# Patient Record
Sex: Female | Born: 1995
Health system: Southern US, Community
[De-identification: ages and names within clinical notes are randomized; demographics above are authoritative.]

## PROBLEM LIST (undated history)

## (undated) DIAGNOSIS — E119 Type 2 diabetes mellitus without complications: Secondary | ICD-10-CM

---
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
## 2014-01-01 ENCOUNTER — Encounter (HOSPITAL_COMMUNITY): Payer: Self-pay | Admitting: Otolaryngology

## 2014-01-01 DIAGNOSIS — Z930 Tracheostomy status: Secondary | ICD-10-CM

## 2014-01-01 DIAGNOSIS — J384 Edema of larynx: Secondary | ICD-10-CM

## 2014-01-01 DIAGNOSIS — J390 Retropharyngeal and parapharyngeal abscess: Principal | ICD-10-CM

## 2014-01-01 DIAGNOSIS — E119 Type 2 diabetes mellitus without complications: Secondary | ICD-10-CM

## 2014-01-01 DIAGNOSIS — Z430 Encounter for attention to tracheostomy: Secondary | ICD-10-CM

## 2014-01-01 LAB — CBC WITH DIFFERENTIAL/PLATELET
BASOS ABS: 0 10*3/uL (ref 0.0–0.1)
Basophils Relative: 0 % (ref 0–1)
Eosinophils Absolute: 0 10*3/uL (ref 0.0–0.7)
Eosinophils Relative: 0 % (ref 0–5)
HCT: 30.7 % — ABNORMAL LOW (ref 36.0–46.0)
Hemoglobin: 10.1 g/dL — ABNORMAL LOW (ref 12.0–15.0)
Lymphocytes Relative: 12 % (ref 12–46)
Lymphs Abs: 1.1 10*3/uL (ref 0.7–4.0)
MCH: 27.2 pg (ref 26.0–34.0)
MCHC: 32.9 g/dL (ref 30.0–36.0)
MCV: 82.7 fL (ref 78.0–100.0)
Monocytes Absolute: 0.5 10*3/uL (ref 0.1–1.0)
Monocytes Relative: 6 % (ref 3–12)
NEUTROS ABS: 7.7 10*3/uL (ref 1.7–7.7)
Neutrophils Relative %: 83 % — ABNORMAL HIGH (ref 43–77)
PLATELETS: 279 10*3/uL (ref 150–400)
RBC: 3.71 MIL/uL — ABNORMAL LOW (ref 3.87–5.11)
RDW: 13.3 % (ref 11.5–15.5)
WBC: 9.3 10*3/uL (ref 4.0–10.5)

## 2014-01-01 LAB — GLUCOSE, CAPILLARY
GLUCOSE-CAPILLARY: 129 mg/dL — AB (ref 70–99)
GLUCOSE-CAPILLARY: 129 mg/dL — AB (ref 70–99)
GLUCOSE-CAPILLARY: 221 mg/dL — AB (ref 70–99)
Glucose-Capillary: 165 mg/dL — ABNORMAL HIGH (ref 70–99)
Glucose-Capillary: 185 mg/dL — ABNORMAL HIGH (ref 70–99)
Glucose-Capillary: 135 mg/dL — ABNORMAL HIGH (ref 70–99)

## 2014-01-01 LAB — FUNGUS CULTURE W SMEAR: Fungal Smear: NONE SEEN

## 2014-01-01 LAB — BASIC METABOLIC PANEL
BUN: 7 mg/dL (ref 6–23)
CO2: 24 meq/L (ref 19–32)
CREATININE: 0.32 mg/dL — AB (ref 0.50–1.10)
Calcium: 9.2 mg/dL (ref 8.4–10.5)
Chloride: 99 mEq/L (ref 96–112)
GFR calc Af Amer: >90 mL/min (ref >90)
GFR calc non Af Amer: >90 mL/min (ref >90)
GLUCOSE: 208 mg/dL — AB (ref 70–99)
Potassium: 4.1 mEq/L (ref 3.7–5.3)
Sodium: 139 mEq/L (ref 137–147)

## 2014-01-01 MED ORDER — DIPHENHYDRAMINE HCL 50 MG/ML IJ SOLN: 25.0000 mg | Freq: Four times a day (QID) | INTRAMUSCULAR | Status: DC | PRN

## 2014-01-01 MED ORDER — DIPHENHYDRAMINE HCL 50 MG/ML IJ SOLN
25.0000 mg | Freq: Four times a day (QID) | INTRAMUSCULAR | Status: DC | PRN
  Administered 2014-01-02 (×2): 25 mg via INTRAVENOUS
  Filled 2014-01-01 (×2): qty 1

## 2014-01-01 MED ORDER — CEFTRIAXONE SODIUM 2 G IJ SOLR
2.0000 g | INTRAMUSCULAR | Status: DC
  Administered 2014-01-01 – 2014-01-04 (×4): 2 g via INTRAVENOUS
  Filled 2014-01-01 (×5): qty 2

## 2014-01-01 MED ORDER — GLUCERNA 1.2 CAL PO LIQD
1000.0000 mL | ORAL | Status: DC
  Administered 2014-01-01 – 2014-01-02 (×2): 1000 mL
  Filled 2014-01-01 (×5): qty 1000

## 2014-01-01 MED ORDER — INSULIN ASPART PROT & ASPART (70-30 MIX) 100 UNIT/ML ~~LOC~~ SUSP
16.0000 [IU] | Freq: Two times a day (BID) | SUBCUTANEOUS | Status: DC
  Administered 2014-01-01 – 2014-01-05 (×8): 16 [IU] via SUBCUTANEOUS
  Filled 2014-01-01: qty 10

## 2014-01-01 NOTE — Progress Notes (Signed)
Subjective: Patient seen at bedside this AM. No obvious complaints, in no acute distress. Pain adequately controlled, no respiratory distress, on trach collar. No fever, chills, nausea or vomiting.   S/p tracheostomy d/t laryngeal edema. NGT (panda) in place.  Objective: Vital signs in last 24 hours: Filed Vitals:   01/01/14 0001 01/01/14 0403 01/01/14 0435 01/01/14 0717  BP: 130/78  102/66 111/59  Pulse: 74 68 57 64  Temp:   98.2 F (36.8 C) 98.8 F (37.1 C)  TempSrc:   Oral Oral  Resp: 12 15 13 13   Height:      Weight:      SpO2: 100% 100% 100% 100%   Weight change:   Intake/Output Summary (Last 24 hours) at 01/01/14 0743 Last data filed at 01/01/14 1610  Gross per 24 hour  Intake 3328.33 ml  Output   2105 ml  Net 1223.33 ml   Physical Exam: General: Alert, cooperative, NAD. On trach collar.  HEENT: PERRL, EOMI.  Lungs: Normal work of respiration. No wheezes, rales, or rhonchi. Heart: RRR. No murmurs, gallops, or rubs. Abdomen: Soft, non-tender, non-distended. BS + Extremities: No cyanosis, clubbing, or edema Neurologic: Alert & oriented X3, no focal sensory or motor abnormalities  Lab Results: Basic Metabolic Panel:  Recent Labs Lab 12/29/13 0256 12/30/13 0450  NA 142 135*  K 4.0 3.7  CL 102 96  CO2 27 25  GLUCOSE 200* 247*  BUN 6 4*  CREATININE 0.42* 0.34*  CALCIUM 8.7 8.7   CBC:  Recent Labs Lab 12/31/13 1035 01/01/14 0300  WBC 8.4 9.3  NEUTROABS 6.1 7.7  HGB 10.1* 10.1*  HCT 31.3* 30.7*  MCV 83.9 82.7  PLT 254 279   CBG:  Recent Labs Lab 12/31/13 1152 12/31/13 1759 12/31/13 2001 12/31/13 2342 01/01/14 0434 01/01/14 0708  GLUCAP 126* 171* 239* 221* 165* 185*   Micro Results: Recent Results (from the past 240 hour(s))  SURGICAL PCR SCREEN     Status: None   Collection Time    12/29/13  7:36 AM      Result Value Ref Range Status   MRSA, PCR NEGATIVE  NEGATIVE Final   Staphylococcus aureus NEGATIVE  NEGATIVE Final   Comment:             The Xpert SA Assay (FDA     approved for NASAL specimens     in patients over 68 years of age),     is one component of     a comprehensive surveillance     program.  Test performance has     been validated by [employer] for patients greater     than or equal to 66 year old.     It is not intended     to diagnose infection nor to     guide or monitor treatment.  ANAEROBIC CULTURE     Status: None   Collection Time    12/29/13  9:37 AM      Result Value Ref Range Status   Specimen Description ABSCESS NECK RIGHT   Final   Special Requests PT ON UNISYN   Final   Gram Stain     Final   Value: NO WBC SEEN     NO SQUAMOUS EPITHELIAL CELLS SEEN     NO ORGANISMS SEEN     Performed at [facility]   Culture     Final   Value: NO ANAEROBES ISOLATED; CULTURE IN PROGRESS FOR 5  DAYS     Performed at [facility]   Report Status PENDING   Incomplete  FUNGUS CULTURE W SMEAR     Status: None   Collection Time    12/29/13  9:37 AM      Result Value Ref Range Status   Specimen Description ABSCESS NECK RIGHT   Final   Special Requests PT ON UNISYN   Final   Fungal Smear     Final   Value: NO YEAST OR FUNGAL ELEMENTS SEEN     Performed at [facility]   Culture     Final   Value: CULTURE IN PROGRESS FOR FOUR WEEKS     Performed at [facility]   Report Status PENDING   Incomplete  AFB CULTURE WITH SMEAR     Status: None   Collection Time    12/29/13  9:37 AM      Result Value Ref Range Status   Specimen Description ABSCESS NECK RIGHT   Final   Special Requests PT ON UNISYN   Final   ACID FAST SMEAR     Final   Value: NO ACID FAST BACILLI SEEN     Performed at [facility]   Culture     Final   Value: CULTURE WILL BE EXAMINED FOR 6 WEEKS BEFORE ISSUING A FINAL REPORT     Performed at [facility]   Report Status PENDING   Incomplete  CULTURE, ROUTINE-ABSCESS     Status: None   Collection Time    12/29/13  9:37 AM       Result Value Ref Range Status   Specimen Description ABSCESS NECK RIGHT   Final   Special Requests PT ON UNISYN   Final   Gram Stain     Final   Value: ABUNDANT WBC PRESENT,BOTH PMN AND MONONUCLEAR     NO SQUAMOUS EPITHELIAL CELLS SEEN     ABUNDANT GRAM NEGATIVE RODS     Performed at [facility]     Performed at [facility]   Culture     Final   Value: MODERATE KLEBSIELLA PNEUMONIAE     Performed at [facility]   Report Status 12/31/2013 FINAL   Final   Organism ID, Bacteria KLEBSIELLA PNEUMONIAE   Final  GRAM STAIN     Status: None   Collection Time    12/29/13  9:37 AM      Result Value Ref Range Status   Specimen Description ABSCESS NECK RIGHT   Final   Special Requests PT ON UNISYN   Final   Gram Stain     Final   Value: ABUNDANT WBC PRESENT,BOTH PMN AND MONONUCLEAR     ABUNDANT GRAM NEGATIVE RODS   Report Status 12/29/2013 FINAL   Final   Medications: I have reviewed the patient's current medications. Scheduled Meds: . ciprofloxacin  400 mg Intravenous Q12H  . heparin subcutaneous  5,000 Units Subcutaneous 3 times per day  . insulin aspart  0-15 Units Subcutaneous Q4H  . insulin aspart protamine- aspart  13 Units Subcutaneous BID WC  . oxymetazoline  1 spray Each Nare BID   Continuous Infusions: . sodium chloride 1,000 mL (12/31/13 2222)   PRN Meds:.diphenhydrAMINE, HYDROcodone-acetaminophen, lidocaine, morphine injection, promethazine, promethazine  Assessment/Plan: [name] White is a 18 y.o. female w/ PMHx of uncontrolled DM type I (A1c 14%) and recent history of peritonsillar abscess s/p tonsillectomy and I&D, admitted for new right-sided lymph node abscess as  seen by neck CT.  Abscess of lymph node of neck: Patient s/p I&D of right lymph node abscess (12/29/13), culture positive for Klebsiella. Yesterday, patient w/ increased difficulty swallowing and swelling. Worsened laryngeal edema seen on laryngoscopy. Given dose of Decadron  yesterday (12/31/13). ENT performed tracheostomy, now on trach collar, in no acute distress. Unasyn changed to Cipro. Patient continues to have no leukocytosis. -ENT following, appreciate recommendations. Seen by Dr. Lazarus SalinesWolicki today, consulted ID for ABx recommendations given worsened clinical picture on Unasyn. Changed to Cipro yesterday, switched to Rocephin IV for now.  -For repeat laryngoscopy on Wednesday.  -Start tube feeds as per dietitian consult. -Hycet 7.5-325 (10-20 ml) q4h prn + Morphine prn for breakthrough -Phenergan prn for nausea  DM Type I: HbA1c as of 12/21/13, 14%. CBG's in the 160-220 range. On 70/30 13 units bid. Panda in place. To start tube feeds today. -Tube feeds per dietitian consult -Increase 70/30 to 16 units bid. If not adequately controlled, will switch to q8h 70/30. -SSI moderate -Repeat BMP in AM  DV PPx: Heparin Dalton  Dispo: Disposition is deferred at this time, awaiting improvement of current medical problems.   The patient does have a current PCP (Jeanann Lewandowskylugbemiga Jegede, MD) and [facility] after discharge.  The patient does not have transportation limitations that hinder transportation to clinic appointments.  .Services Needed at time of discharge: Y = Yes, Blank = No PT:   OT:   RN:   Equipment:   Other:     LOS: 4 days   Courtney ParisEden W Rumor Sun, MD 01/01/2014, 7:43 AM Pager: [phone]

## 2014-01-01 NOTE — Progress Notes (Signed)
Internal Medicine Attending  Date: 01/01/2014  Patient name: [name] White Medical record number: [id number] Date of birth: [birth year] Age: 18 y.o. Gender: female  I saw and evaluated the patient. I reviewed the resident's note by Dr. Yetta BarreJones and I agree with the resident's findings and plans as documented in his progress note.  S/p trach this weekend for worsening laryngeal edema.  Feels well this AM when seen on rounds although not very communicative.  Appreciate ID and ENT input and we will follow their advice.  Narrow to IV ceftriaxone and continue current supportive care.

## 2014-01-01 NOTE — Clinical Documentation Improvement (Signed)
THIS DOCUMENT IS NOT A PERMANENT PART OF THE MEDICAL RECORD  Please update your documentation with the medical record to reflect your response to this query. If you need help knowing how to do this please call [phone].  01/01/14   Dear Dr. Maximino SarinKlima/Associates,  In a better effort to capture your patient's severity of illness, reflect appropriate length of stay and utilization of resources, a review of the patient medical record has revealed the following indicators.    Based on your clinical judgment, please clarify and document in a progress note and/or discharge summary the clinical condition associated with the following supporting information:  In responding to this query please exercise your independent judgment.  The fact that a query is asked, does not imply that any particular answer is desired or expected.  Please clarify and specify Diabetes control, manifestations, and associated conditions.   Possible Clinical Conditions?   _______Diabetes: _______Controlled or Uncontrolled  Manifestations:  _______DM retinopathy  _______DM PVD _______DM neuropathy   _______DM nephropathy  Associated conditions: _______DM cellulitis _______DM gangrene _______DM gastroparesis _______DM hyperosmolarity state _______DM ketoacidosis with or without coma _______DM osteomyelitis _______DM skin ulcer  _______Other Condition _______Cannot Clinically determine     Risk Factors: Patient with a history of diabetes mellitus type 1, hgb A1c 14 on 12/21/13, noted per 02/19 progress notes.   You may use possible, probable, or suspect with inpatient documentation. possible, probable, suspected diagnoses MUST be documented at the time of discharge  Reviewed:  Additional documentation provided per 01/02/14 progress notes.                   Thank You,  Marciano SequinWanda Mathews-Bethea,RN,BSN, Clinical Documentation Specialist: [phone] Health Information Management [place]

## 2014-01-01 NOTE — Progress Notes (Signed)
INITIAL NUTRITION ASSESSMENT  DOCUMENTATION CODES Per approved criteria  -Not Applicable   INTERVENTION: Initiate Glucerna 1.2 @ 20 ml/hr via NGT and increase by 10 ml every 4 hours to goal rate of 60 ml/hr. At goal rate, tube feeding regimen will provide 1728 kcal, 86 grams of protein, and 1159 ml of H2O.  RD to continue to follow nutrition care plan.  NUTRITION DIAGNOSIS: Inadequate oral intake related to inability to eat as evidenced by NPO status.   Goal: Intake to meet >90% of estimated nutrition needs.  Monitor:  weight trends, lab trends, I/O's, initiation/tolerance of EN, initiation of PO's  Reason for Assessment: MD Consult for Initiation of Nutrition Support  18 y.o. female  Admitting Dx: Abscess of lymph node of neck  ASSESSMENT: PMHx significant for DM, L parapharyngeal abscess I&D and s/p L tonsillectomy 2/1. Admitted with rapid onset of R neck swelling and pain. Work-up reveals 2 R abscessed lymphnodes.  Underwent I&D of R-sided lymph node abscess 2/20. Pt with increased swallowing, throat pain and swelling on 2/22. Marked edema found to be causing difficulty swallowing, underwent trach to stabilize airway with placement of feeding tube on 2/22. Pt with small-bore feeding tube in place at this time, per radiology report, "small bore feeding tube with tip overlying the mid stomach."  Ordered for Carbohydrate Modified Medium diet on 2/20 - 2/22, was eating 75% of meals. Brief physical exam reveals pt is without significant fat or muscle mass loss.  Pt's DM is "out of control" per chart. Pt with HgbA1c of 14%. CBG's: 221, 165, 185.  No BM yet this admission.  Currently receiving IVF of NS at 100 ml/hr.  Height: Ht Readings from Last 1 Encounters:  12/28/13 5\' 5"  (1.651 m) (62%*, Z = 0.29)   * Growth percentiles are based on CDC 2-20 Years data.    Weight: Wt Readings from Last 1 Encounters:  12/28/13 134 lb 0.6 oz (60.8 kg) (65%*, Z = 0.40)   * Growth  percentiles are based on CDC 2-20 Years data.    Ideal Body Weight: 125 lb  % Ideal Body Weight: 107%  Wt Readings from Last 10 Encounters:  12/28/13 134 lb 0.6 oz (60.8 kg) (65%*, Z = 0.40)  12/28/13 134 lb 0.6 oz (60.8 kg) (65%*, Z = 0.40)  12/28/13 134 lb 0.6 oz (60.8 kg) (65%*, Z = 0.40)  12/26/13 138 lb (62.596 kg) (71%*, Z = 0.55)  12/21/13 134 lb (60.782 kg) (65%*, Z = 0.40)  12/07/13 135 lb (61.236 kg) (67%*, Z = 0.44)  12/07/13 135 lb (61.236 kg) (67%*, Z = 0.44)   * Growth percentiles are based on CDC 2-20 Years data.    Usual Body Weight: 135 lb  % Usual Body Weight: 100%  BMI:  Body mass index is 22.31 kg/(m^2). Normal weight  Estimated Nutritional Needs: Kcal: 1700 - 1900 Protein: 75 - 85 g Fluid: 1.7 - 1.9 liters daily  Skin:  R neck incision Bilateral neck incision  Diet Order: NPO  EDUCATION NEEDS: -No education needs identified at this time   Intake/Output Summary (Last 24 hours) at 01/01/14 1001 Last data filed at 01/01/14 0717  Gross per 24 hour  Intake 3328.33 ml  Output   2105 ml  Net 1223.33 ml    Last BM: PTA  Labs:   Recent Labs Lab 12/29/13 0256 12/30/13 0450 01/01/14 0758  NA 142 135* 139  K 4.0 3.7 4.1  CL 102 96 99  CO2 27 25 24  BUN 6 4* 7  CREATININE 0.42* 0.34* 0.32*  CALCIUM 8.7 8.7 9.2  GLUCOSE 200* 247* 208*    CBG (last 3)   Recent Labs  12/31/13 2342 01/01/14 0434 01/01/14 0708  GLUCAP 221* 165* 185*    Scheduled Meds: . ciprofloxacin  400 mg Intravenous Q12H  . heparin subcutaneous  5,000 Units Subcutaneous 3 times per day  . insulin aspart  0-15 Units Subcutaneous Q4H  . insulin aspart protamine- aspart  16 Units Subcutaneous BID WC  . oxymetazoline  1 spray Each Nare BID    Continuous Infusions: . sodium chloride 1,000 mL (12/31/13 2222)    Past Medical History  Diagnosis Date  . Diabetes mellitus without complication     had since she was 16    Past Surgical History  Procedure  Laterality Date  . Incision and drainage of peritonsillar abcess N/A 12/10/2013    Procedure: INCISION AND DRAINAGE OF PERITONSILLAR ABCESS;  Surgeon: Flo ShanksKarol Wolicki, MD;  [facility];  Service: ENT;  Laterality: N/A;  . Tonsillectomy Left 12/10/2013    Procedure: TONSILLECTOMY;  Surgeon: Flo ShanksKarol Wolicki, MD;  [facility];  Service: ENT;  Laterality: Left;  . Tonsillectomy  12/10/2013    Jarold MottoSamantha Shavawn Stobaugh MS, RD, LDN Inpatient Registered Dietitian Pager: [phone] After-hours pager: [phone]

## 2014-01-01 NOTE — Progress Notes (Signed)
Inpatient Diabetes Program Recommendations  AACE/ADA: New Consensus Statement on Inpatient Glycemic Control (2013)  Target Ranges:  Prepandial:   less than 140 mg/dL      Peak postprandial:   less than 180 mg/dL (1-2 hours)      Critically ill patients:  140 - 180 mg/dL   Reason for Visit: Type 1 diabetes.  Note patient is on continuous tube feeds due to trach/infection.  Consider changing frequency on Novolog 70/30 to q 8 hours due to continuous feeds.  May consider Novolog 70/30-10 units q 8 hours while on tube feeds.  Once patient is able to eat, then may change back to bid dosing.  Thanks, Beryl MeagerJenny Emmerie Battaglia, RN, BC-ADM Inpatient Diabetes Coordinator Pager [phone]

## 2014-01-01 NOTE — Progress Notes (Signed)
Patient ID: [name] SicksJuliana White, female   DOB: [birth year], 18 y.o.   MRN: [id number]         [facility]    Date of Admission:  12/28/2013   Prior antibiotic therapy from 1/29-2/10/2014        Day 6 current antibiotic therapy        Day 2 ciprofloxacin         Principal Problem:   Abscess of lymph node of neck Active Problems:   Parapharyngeal abscess   Diabetes mellitus type 1   Medically noncompliant   Dysphagia, oropharyngeal   Tracheostomy, acute management   . ciprofloxacin  400 mg Intravenous Q12H  . heparin subcutaneous  5,000 Units Subcutaneous 3 times per day  . insulin aspart  0-15 Units Subcutaneous Q4H  . insulin aspart protamine- aspart  16 Units Subcutaneous BID WC  . oxymetazoline  1 spray Each Nare BID   Objective: Temp:  [98.1 F (36.7 C)-99.2 F (37.3 C)] 98.7 F (37.1 C) (02/23 1301) Pulse Rate:  [57-105] 63 (02/23 1301) Resp:  [11-23] 13 (02/23 1301) BP: (92-131)/(44-86) 92/44 mmHg (02/23 1158) SpO2:  [98 %-100 %] 100 % (02/23 1301) FiO2 (%):  [28 %-40 %] 28 % (02/23 0717) Weight:  [63.4 kg (139 lb 12.4 oz)] 63.4 kg (139 lb 12.4 oz) (02/23 1108)  General: Alert with new tracheostomy Lungs: Clear Cor: Regular S1 and S2 with no murmurs  Lab Results Lab Results  Component Value Date   WBC 9.3 01/01/2014   HGB 10.1* 01/01/2014   HCT 30.7* 01/01/2014   MCV 82.7 01/01/2014   PLT 279 01/01/2014    Lab Results  Component Value Date   CREATININE 0.32* 01/01/2014   BUN 7 01/01/2014   NA 139 01/01/2014   K 4.1 01/01/2014   CL 99 01/01/2014   CO2 24 01/01/2014    Lab Results  Component Value Date   ALT 14 12/07/2013   AST 19 12/07/2013   ALKPHOS 153* 12/07/2013   BILITOT <0.2* 12/07/2013      Microbiology: Recent Results (from the past 240 hour(s))  SURGICAL PCR SCREEN     Status: None   Collection Time    12/29/13  7:36 AM      Result Value Ref Range Status   MRSA, PCR NEGATIVE  NEGATIVE Final   Staphylococcus aureus  NEGATIVE  NEGATIVE Final   Comment:            The Xpert SA Assay (FDA     approved for NASAL specimens     in patients over 19 years of age),     is one component of     a comprehensive surveillance     program.  Test performance has     been validated by [facility] for patients greater     than or equal to 19 year old.     It is not intended     to diagnose infection nor to     guide or monitor treatment.  ANAEROBIC CULTURE     Status: None   Collection Time    12/29/13  9:37 AM      Result Value Ref Range Status   Specimen Description ABSCESS NECK RIGHT   Final   Special Requests PT ON UNISYN   Final   Gram Stain     Final   Value: NO WBC SEEN     NO SQUAMOUS EPITHELIAL CELLS SEEN  NO ORGANISMS SEEN     Performed at [facility]   Culture     Final   Value: NO ANAEROBES ISOLATED; CULTURE IN PROGRESS FOR 5 DAYS     Performed at [facility]   Report Status PENDING   Incomplete  FUNGUS CULTURE W SMEAR     Status: None   Collection Time    12/29/13  9:37 AM      Result Value Ref Range Status   Specimen Description ABSCESS NECK RIGHT   Final   Special Requests PT ON UNISYN   Final   Fungal Smear     Final   Value: NO YEAST OR FUNGAL ELEMENTS SEEN     Performed at [facility]   Culture     Final   Value: CULTURE IN PROGRESS FOR FOUR WEEKS     Performed at [facility]   Report Status PENDING   Incomplete  AFB CULTURE WITH SMEAR     Status: None   Collection Time    12/29/13  9:37 AM      Result Value Ref Range Status   Specimen Description ABSCESS NECK RIGHT   Final   Special Requests PT ON UNISYN   Final   ACID FAST SMEAR     Final   Value: NO ACID FAST BACILLI SEEN     Performed at [facility]   Culture     Final   Value: CULTURE WILL BE EXAMINED FOR 6 WEEKS BEFORE ISSUING A FINAL REPORT     Performed at [facility]   Report Status PENDING   Incomplete  CULTURE, ROUTINE-ABSCESS     Status: None    Collection Time    12/29/13  9:37 AM      Result Value Ref Range Status   Specimen Description ABSCESS NECK RIGHT   Final   Special Requests PT ON UNISYN   Final   Gram Stain     Final   Value: ABUNDANT WBC PRESENT,BOTH PMN AND MONONUCLEAR     NO SQUAMOUS EPITHELIAL CELLS SEEN     ABUNDANT GRAM NEGATIVE RODS     Performed at [facility]     Performed at [facility]   Culture     Final   Value: MODERATE KLEBSIELLA PNEUMONIAE     Performed at [facility]   Report Status 12/31/2013 FINAL   Final   Organism ID, Bacteria KLEBSIELLA PNEUMONIAE   Final  GRAM STAIN     Status: None   Collection Time    12/29/13  9:37 AM      Result Value Ref Range Status   Specimen Description ABSCESS NECK RIGHT   Final   Special Requests PT ON UNISYN   Final   Gram Stain     Final   Value: ABUNDANT WBC PRESENT,BOTH PMN AND MONONUCLEAR     ABUNDANT GRAM NEGATIVE RODS   Report Status 12/29/2013 FINAL   Final    Studies/Results: Ct Soft Tissue Neck W Contrast  12/31/2013   CLINICAL DATA:  Diabetes. Surgical drainage of left peritonsillar abscess 12/10/2013. Surgical drainage of abscessed lymph node in the right neck 12/28/2013. Persistent right neck swelling.  EXAM: CT NECK WITH CONTRAST  TECHNIQUE: Multidetector CT imaging of the neck was performed using the standard protocol following the bolus administration of intravenous contrast.  CONTRAST:  80mL OMNIPAQUE IOHEXOL 300 MG/ML  SOLN  COMPARISON:  CT neck 12/26/2013, 12/10/2013  FINDINGS: Surgical drain  in the right neck. There is extensive soft tissue edema in the right neck involving the sternocleidomastoid muscle and parapharyngeal soft tissues. Soft tissue edema causes displacement of the airway to the left. There is marked edema of the aryepiglottic fold on the right and hypopharynx on the right. There is also some edema involving the glottis on the right. No definite abscess is seen and this is most consistent with phlegmon.  Posterior low-density lymph nodes are present in the right upper neck measuring approximately 17 x 28 mm, and 14 mm each.  Prevertebral soft tissue swelling measures 14 mm and has progressed from the prior study.  The adenoid tissue is prominent. No peritonsillar abscess is identified. No enlarged lymph nodes in the left neck.  Parotid and submandibular glands and thyroid glands are normal bilaterally. Lung apices are clear. No acute bony change.  IMPRESSION: Extensive edema throughout the right neck which has progressed considerably since the prior study. Surgical drain is present in the right neck. There is edema involving the hypopharynx and larynx on the right displacing and narrowing the airway. There is progression of prevertebral soft tissue swelling. This is likely due to progressive infection however no abscess is identified.  I discussed the findings by telephone with Dr. Jenne Pane.   Electronically Signed   By: Marlan Palau M.D.   On: 12/31/2013 15:05   Dg Abd Portable 1v  12/31/2013   CLINICAL DATA:  Feeding tube placement.  EXAM: PORTABLE ABDOMEN - 1 VIEW  COMPARISON:  None.  FINDINGS: A small bore feeding tube is identified with tip overlying the mid stomach.  The bowel gas pattern is unremarkable.  IMPRESSION: Small bore feeding tube with tip overlying the mid stomach.   Electronically Signed   By: Laveda Abbe M.D.   On: 12/31/2013 21:35    Assessment: Ms. Lindvall has poorly controlled diabetes and recently developed a left parapharyngeal abscess. A throat culture on January 29 grew a beta hemolytic strep (nongroup A). She was hospitalized and underwent incision and drainage of the abscess. Operative cultures grew Klebsiella sensitive to ampicillin sulbactam and amoxicillin clavulanate. She was discharged home on February 5 and completed 2 weeks of total antibiotic therapy on February 12. She started to develop new pain and swelling in her right neck and was seen in the emergency department on  February 18 where her Augmentin was restarted. She was hospitalized on February 19 and found to have suffered no the right neck. She underwent incision and drainage the following day and cultures have again grown the same strain of Klebsiella. She had further laryngeal edema and required tracheostomy yesterday.  It does not appear that she has developed superinfection or antibiotic resistant Klebsiella. I suspect that her recurrence was related to her diabetes, relatively poor absorption of her oral antibiotic and, in retrospect, a course of initial antibiotic therapy that was too short. I agree with continuing IV antibiotic therapy for now but I would favor switching ciprofloxacin to ceftriaxone to avoid overly broad antibiotic therapy and its potential consequences.  Plan: 1. Narrow antibiotic therapy to IV ceftriaxone  Cliffton Asters, MD [facility] [phone] pager   [phone] cell 01/01/2014, 3:44 PM

## 2014-01-01 NOTE — Progress Notes (Signed)
01/01/2014 9:15 AM  [name] White White, [name] White [id number]  Post-Op Day 1/3    Temp:  [98.1 F (36.7 C)-99.2 F (37.3 C)] 98.8 F (37.1 C) (02/23 0717) Pulse Rate:  [57-105] 64 (02/23 0717) Resp:  [11-23] 13 (02/23 0717) BP: (102-131)/(59-86) 111/59 mmHg (02/23 0717) SpO2:  [99 %-100 %] 100 % (02/23 0717) FiO2 (%):  [28 %-40 %] 28 % (02/23 0717),     Intake/Output Summary (Last 24 hours) at 01/01/14 0915 Last data filed at 01/01/14 [id number]  Gross per 24 hour  Intake 3328.33 ml  Output   2105 ml  Net 1223.33 ml    Results for orders placed during the hospital encounter of 12/28/13 (from the past 24 hour(s))  CBC WITH DIFFERENTIAL     Status: Abnormal   Collection Time    12/31/13 10:35 AM      Result Value Ref Range   WBC 8.4  4.0 - 10.5 K/uL   RBC 3.73 (*) 3.87 - 5.11 MIL/uL   Hemoglobin 10.1 (*) 12.0 - 15.0 g/dL   HCT 11.931.3 (*) 14.736.0 - 82.946.0 %   MCV 83.9  78.0 - 100.0 fL   MCH 27.1  26.0 - 34.0 pg   MCHC 32.3  30.0 - 36.0 g/dL   RDW 56.213.4  13.011.5 - 86.515.5 %   Platelets 254  150 - 400 K/uL   Neutrophils Relative % 72  43 - 77 %   Neutro Abs 6.1  1.7 - 7.7 K/uL   Lymphocytes Relative 18  12 - 46 %   Lymphs Abs 1.5  0.7 - 4.0 K/uL   Monocytes Relative 10  3 - 12 %   Monocytes Absolute 0.8  0.1 - 1.0 K/uL   Eosinophils Relative 1  0 - 5 %   Eosinophils Absolute 0.0  0.0 - 0.7 K/uL   Basophils Relative 0  0 - 1 %   Basophils Absolute 0.0  0.0 - 0.1 K/uL  GLUCOSE, CAPILLARY     Status: Abnormal   Collection Time    12/31/13 11:52 AM      Result Value Ref Range   Glucose-Capillary 126 (*) 70 - 99 mg/dL   Comment 1 Notify RN    GLUCOSE, CAPILLARY     Status: Abnormal   Collection Time    12/31/13  5:59 PM      Result Value Ref Range   Glucose-Capillary 171 (*) 70 - 99 mg/dL  GLUCOSE, CAPILLARY     Status: Abnormal   Collection Time    12/31/13  8:01 PM      Result Value Ref Range   Glucose-Capillary 239 (*) 70 - 99 mg/dL   Comment 1 Notify RN    GLUCOSE, CAPILLARY     Status:  Abnormal   Collection Time    12/31/13 11:42 PM      Result Value Ref Range   Glucose-Capillary 221 (*) 70 - 99 mg/dL   Comment 1 Notify RN    CBC WITH DIFFERENTIAL     Status: Abnormal   Collection Time    01/01/14  3:00 AM      Result Value Ref Range   WBC 9.3  4.0 - 10.5 K/uL   RBC 3.71 (*) 3.87 - 5.11 MIL/uL   Hemoglobin 10.1 (*) 12.0 - 15.0 g/dL   HCT 78.430.7 (*) 69.636.0 - 29.546.0 %   MCV 82.7  78.0 - 100.0 fL   MCH 27.2  26.0 - 34.0 pg   MCHC 32.9  30.0 - 36.0  g/dL   RDW 78.2  95.6 - 21.3 %   Platelets 279  150 - 400 K/uL   Neutrophils Relative % 83 (*) 43 - 77 %   Neutro Abs 7.7  1.7 - 7.7 K/uL   Lymphocytes Relative 12  12 - 46 %   Lymphs Abs 1.1  0.7 - 4.0 K/uL   Monocytes Relative 6  3 - 12 %   Monocytes Absolute 0.5  0.1 - 1.0 K/uL   Eosinophils Relative 0  0 - 5 %   Eosinophils Absolute 0.0  0.0 - 0.7 K/uL   Basophils Relative 0  0 - 1 %   Basophils Absolute 0.0  0.0 - 0.1 K/uL  GLUCOSE, CAPILLARY     Status: Abnormal   Collection Time    01/01/14  4:34 AM      Result Value Ref Range   Glucose-Capillary 165 (*) 70 - 99 mg/dL   Comment 1 Notify RN    GLUCOSE, CAPILLARY     Status: Abnormal   Collection Time    01/01/14  7:08 AM      Result Value Ref Range   Glucose-Capillary 185 (*) 70 - 99 mg/dL  BASIC METABOLIC PANEL     Status: Abnormal   Collection Time    01/01/14  7:58 AM      Result Value Ref Range   Sodium 139  137 - 147 mEq/L   Potassium 4.1  3.7 - 5.3 mEq/L   Chloride 99  96 - 112 mEq/L   CO2 24  19 - 32 mEq/L   Glucose, Bld 208 (*) 70 - 99 mg/dL   BUN 7  6 - 23 mg/dL   Creatinine, Ser 0.86 (*) 0.50 - 1.10 mg/dL   Calcium 9.2  8.4 - 57.8 mg/dL   GFR calc non Af Amer >90  >90 mL/min   GFR calc Af Amer >90  >90 mL/min    SUBJECTIVE:  Writes very little.  No activity yet.  Pain controlled.  OBJECTIVE:  Trach clean and secure.  Ventilating well.  Neck with min purulent drainage.  Drain advanced  IMPRESSION:  Puzzling that the infection seems worse  despite drainage and abx therapy.  I reviewed the CT scan and do feel I successfully drained high RIGHT neck loculations.  If she is developing retropharyngeal/prevertebral extension, this has not been drained thus far.    PLAN:  ID Consult.  Trach care.  Will deflate cuff and begin speaking and swallowing trials tomorrow.  Repeat flex laryngoscopy Wed.  DM control.  Can probably downsize to cuffless tube when tract is established and laryngeal swelling is improved.  I will do this at the appropriate time.    Flo Shanks

## 2014-01-02 DIAGNOSIS — L0211 Cutaneous abscess of neck: Secondary | ICD-10-CM

## 2014-01-02 DIAGNOSIS — L03221 Cellulitis of neck: Secondary | ICD-10-CM

## 2014-01-02 LAB — CBC
HCT: 28 % — ABNORMAL LOW (ref 36.0–46.0)
Hemoglobin: 9 g/dL — ABNORMAL LOW (ref 12.0–15.0)
MCH: 26.9 pg (ref 26.0–34.0)
MCHC: 32.1 g/dL (ref 30.0–36.0)
MCV: 83.8 fL (ref 78.0–100.0)
PLATELETS: 267 10*3/uL (ref 150–400)
RBC: 3.34 MIL/uL — ABNORMAL LOW (ref 3.87–5.11)
RDW: 13.8 % (ref 11.5–15.5)
WBC: 7.5 10*3/uL (ref 4.0–10.5)

## 2014-01-02 LAB — GLUCOSE, CAPILLARY
GLUCOSE-CAPILLARY: 144 mg/dL — AB (ref 70–99)
GLUCOSE-CAPILLARY: 190 mg/dL — AB (ref 70–99)
GLUCOSE-CAPILLARY: 118 mg/dL — AB (ref 70–99)
Glucose-Capillary: 173 mg/dL — ABNORMAL HIGH (ref 70–99)
Glucose-Capillary: 165 mg/dL — ABNORMAL HIGH (ref 70–99)
Glucose-Capillary: 114 mg/dL — ABNORMAL HIGH (ref 70–99)

## 2014-01-02 LAB — BASIC METABOLIC PANEL
BUN: 11 mg/dL (ref 6–23)
CALCIUM: 8.7 mg/dL (ref 8.4–10.5)
CO2: 27 mEq/L (ref 19–32)
Chloride: 106 mEq/L (ref 96–112)
Creatinine, Ser: 0.4 mg/dL — ABNORMAL LOW (ref 0.50–1.10)
GFR calc non Af Amer: >90 mL/min (ref >90)
Glucose, Bld: 162 mg/dL — ABNORMAL HIGH (ref 70–99)
POTASSIUM: 3.5 meq/L — AB (ref 3.7–5.3)
SODIUM: 144 meq/L (ref 137–147)

## 2014-01-02 LAB — MAGNESIUM: MAGNESIUM: 2 mg/dL (ref 1.5–2.5)

## 2014-01-02 MED ORDER — POTASSIUM CHLORIDE 20 MEQ/15ML (10%) PO LIQD
40.0000 meq | Freq: Every day | ORAL | Status: DC
  Filled 2014-01-02: qty 30

## 2014-01-02 MED ORDER — POTASSIUM CHLORIDE 20 MEQ/15ML (10%) PO LIQD
40.0000 meq | Freq: Once | ORAL | Status: AC
  Administered 2014-01-02: 40 meq
  Filled 2014-01-02: qty 30

## 2014-01-02 MED ORDER — POTASSIUM CHLORIDE 20 MEQ/15ML (10%) PO LIQD: 40.0000 meq | Freq: Every day | ORAL | Status: DC

## 2014-01-02 MED ORDER — LIDOCAINE HCL 2 % EX GEL
Freq: Once | CUTANEOUS | Status: AC
  Administered 2014-01-03: 5
  Filled 2014-01-02: qty 5

## 2014-01-02 NOTE — Progress Notes (Signed)
01/02/2014 10:19 AM  [name] [id number]  Post-Op Day 2/4    Temp:  [97.9 F (36.6 C)-98.9 F (37.2 C)] 97.9 F (36.6 C) (02/24 0748) Pulse Rate:  [60-87] 65 (02/24 0443) Resp:  [8-16] 12 (02/24 0443) BP: (92-115)/(42-60) 100/42 mmHg (02/24 0323) SpO2:  [98 %-100 %] 100 % (02/24 0443) FiO2 (%):  [28 %] 28 % (02/24 0748) Weight:  [63.4 kg (139 lb 12.4 oz)] 63.4 kg (139 lb 12.4 oz) (02/23 1108),     Intake/Output Summary (Last 24 hours) at 01/02/14 1019 Last data filed at 01/02/14 0915  Gross per 24 hour  Intake   2153 ml  Output    250 ml  Net   1903 ml    Results for orders placed during the hospital encounter of 12/28/13 (from the past 24 hour(s))  GLUCOSE, CAPILLARY     Status: Abnormal   Collection Time    01/01/14  1:02 PM      Result Value Ref Range   Glucose-Capillary 135 (*) 70 - 99 mg/dL  GLUCOSE, CAPILLARY     Status: Abnormal   Collection Time    01/01/14  5:08 PM      Result Value Ref Range   Glucose-Capillary 129 (*) 70 - 99 mg/dL  GLUCOSE, CAPILLARY     Status: Abnormal   Collection Time    01/01/14  7:30 PM      Result Value Ref Range   Glucose-Capillary 129 (*) 70 - 99 mg/dL   Comment 1 Documented in Chart     Comment 2 Notify RN    GLUCOSE, CAPILLARY     Status: Abnormal   Collection Time    01/01/14 11:03 PM      Result Value Ref Range   Glucose-Capillary 173 (*) 70 - 99 mg/dL   Comment 1 Documented in Chart     Comment 2 Notify RN    CBC     Status: Abnormal   Collection Time    01/02/14  2:25 AM      Result Value Ref Range   WBC 7.5  4.0 - 10.5 K/uL   RBC 3.34 (*) 3.87 - 5.11 MIL/uL   Hemoglobin 9.0 (*) 12.0 - 15.0 g/dL   HCT 40.9 (*) 81.1 - 91.4 %   MCV 83.8  78.0 - 100.0 fL   MCH 26.9  26.0 - 34.0 pg   MCHC 32.1  30.0 - 36.0 g/dL   RDW 78.2  95.6 - 21.3 %   Platelets 267  150 - 400 K/uL  BASIC METABOLIC PANEL     Status: Abnormal   Collection Time    01/02/14  2:25 AM      Result Value Ref Range   Sodium 144  137 -  147 mEq/L   Potassium 3.5 (*) 3.7 - 5.3 mEq/L   Chloride 106  96 - 112 mEq/L   CO2 27  19 - 32 mEq/L   Glucose, Bld 162 (*) 70 - 99 mg/dL   BUN 11  6 - 23 mg/dL   Creatinine, Ser 0.86 (*) 0.50 - 1.10 mg/dL   Calcium 8.7  8.4 - 57.8 mg/dL   GFR calc non Af Amer >90  >90 mL/min   GFR calc Af Amer >90  >90 mL/min  MAGNESIUM     Status: None   Collection Time    01/02/14  2:25 AM      Result Value Ref Range   Magnesium 2.0  1.5 - 2.5 mg/dL  GLUCOSE, CAPILLARY     Status: Abnormal   Collection Time    01/02/14  3:34 AM      Result Value Ref Range   Glucose-Capillary 165 (*) 70 - 99 mg/dL   Comment 1 Documented in Chart     Comment 2 Notify RN    GLUCOSE, CAPILLARY     Status: Abnormal   Collection Time    01/02/14  7:54 AM      Result Value Ref Range   Glucose-Capillary 144 (*) 70 - 99 mg/dL    SUBJECTIVE:  1/615/10 sore throat.  Neck sl less tender with motion  OBJECTIVE:  Min drainage.  Neck drain removed.  Trach cuff deflated without difficulty.    IMPRESSION:  Slow progress.  Appreciate help from ID.  PLAN:  Speech Path to eval for po swallowing, and for [employer] Muir Valve use.  Will repeat flexible laryngoscopy when more able to phonate and swallow, either tomorrow or Thurs.  Anticipate trach decannulation before leaving hospital.    Flo ShanksWOLICKI, Aniyah Nobis

## 2014-01-02 NOTE — Progress Notes (Signed)
Pt transferred to 6N28, pt belongings and trach supplies sent with pt to new room. Family at bedside and aware of transfer. [facility]. CCMD notified of pt transfer. Pt stable at time of transfer

## 2014-01-02 NOTE — Progress Notes (Signed)
Patient ID: [name] SicksJuliana White, female   DOB: [birth year], 18 y.o.   MRN: [id number]         [facility]    Date of Admission:  12/28/2013   Prior antibiotic therapy from 1/29-2/10/2014        Day 7 current antibiotic therapy        Day 2 ceftriaxone         Principal Problem:   Abscess of lymph node of neck Active Problems:   Parapharyngeal abscess   Diabetes mellitus type 1   Medically noncompliant   Dysphagia, oropharyngeal   Tracheostomy, acute management   . cefTRIAXone (ROCEPHIN)  IV  2 g Intravenous Q24H  . heparin subcutaneous  5,000 Units Subcutaneous 3 times per day  . insulin aspart  0-15 Units Subcutaneous Q4H  . insulin aspart protamine- aspart  16 Units Subcutaneous BID WC  . [START ON 01/03/2014] lidocaine   Other Once  . oxymetazoline  1 spray Each Nare BID  . potassium chloride  40 mEq Per Tube Once   Objective: Temp:  [97.9 F (36.6 C)-98.9 F (37.2 C)] 98.6 F (37 C) (02/24 1300) Pulse Rate:  [60-87] 65 (02/24 0443) Resp:  [8-16] 12 (02/24 0443) BP: (93-131)/(42-60) 131/57 mmHg (02/24 1231) SpO2:  [100 %] 100 % (02/24 0443) FiO2 (%):  [28 %] 28 % (02/24 1300)  General: She is alert, sitting up in bed visiting with family Lungs: Clear Cor: Regular S1 and S2 with no murmurs  Lab Results Lab Results  Component Value Date   WBC 7.5 01/02/2014   HGB 9.0* 01/02/2014   HCT 28.0* 01/02/2014   MCV 83.8 01/02/2014   PLT 267 01/02/2014    Lab Results  Component Value Date   CREATININE 0.40* 01/02/2014   BUN 11 01/02/2014   NA 144 01/02/2014   K 3.5* 01/02/2014   CL 106 01/02/2014   CO2 27 01/02/2014    Lab Results  Component Value Date   ALT 14 12/07/2013   AST 19 12/07/2013   ALKPHOS 153* 12/07/2013   BILITOT <0.2* 12/07/2013      Microbiology: Recent Results (from the past 240 hour(s))  SURGICAL PCR SCREEN     Status: None   Collection Time    12/29/13  7:36 AM      Result Value Ref Range Status   MRSA, PCR NEGATIVE   NEGATIVE Final   Staphylococcus aureus NEGATIVE  NEGATIVE Final   Comment:            The Xpert SA Assay (FDA     approved for NASAL specimens     in patients over 19 years of age),     is one component of     a comprehensive surveillance     program.  Test performance has     been validated by [facility] for patients greater     than or equal to 19 year old.     It is not intended     to diagnose infection nor to     guide or monitor treatment.  ANAEROBIC CULTURE     Status: None   Collection Time    12/29/13  9:37 AM      Result Value Ref Range Status   Specimen Description ABSCESS NECK RIGHT   Final   Special Requests PT ON UNISYN   Final   Gram Stain     Final   Value: NO WBC SEEN  NO SQUAMOUS EPITHELIAL CELLS SEEN     NO ORGANISMS SEEN     Performed at [facility]   Culture     Final   Value: NO ANAEROBES ISOLATED; CULTURE IN PROGRESS FOR 5 DAYS     Performed at [facility]   Report Status PENDING   Incomplete  FUNGUS CULTURE W SMEAR     Status: None   Collection Time    12/29/13  9:37 AM      Result Value Ref Range Status   Specimen Description ABSCESS NECK RIGHT   Final   Special Requests PT ON UNISYN   Final   Fungal Smear     Final   Value: NO YEAST OR FUNGAL ELEMENTS SEEN     Performed at [facility]   Culture     Final   Value: CULTURE IN PROGRESS FOR FOUR WEEKS     Performed at [facility]   Report Status PENDING   Incomplete  AFB CULTURE WITH SMEAR     Status: None   Collection Time    12/29/13  9:37 AM      Result Value Ref Range Status   Specimen Description ABSCESS NECK RIGHT   Final   Special Requests PT ON UNISYN   Final   ACID FAST SMEAR     Final   Value: NO ACID FAST BACILLI SEEN     Performed at [facility]   Culture     Final   Value: CULTURE WILL BE EXAMINED FOR 6 WEEKS BEFORE ISSUING A FINAL REPORT     Performed at [facility]   Report Status PENDING   Incomplete  CULTURE,  ROUTINE-ABSCESS     Status: None   Collection Time    12/29/13  9:37 AM      Result Value Ref Range Status   Specimen Description ABSCESS NECK RIGHT   Final   Special Requests PT ON UNISYN   Final   Gram Stain     Final   Value: ABUNDANT WBC PRESENT,BOTH PMN AND MONONUCLEAR     NO SQUAMOUS EPITHELIAL CELLS SEEN     ABUNDANT GRAM NEGATIVE RODS     Performed at [facility]     Performed at [facility]   Culture     Final   Value: MODERATE KLEBSIELLA PNEUMONIAE     Performed at [facility]   Report Status 12/31/2013 FINAL   Final   Organism ID, Bacteria KLEBSIELLA PNEUMONIAE   Final  GRAM STAIN     Status: None   Collection Time    12/29/13  9:37 AM      Result Value Ref Range Status   Specimen Description ABSCESS NECK RIGHT   Final   Special Requests PT ON UNISYN   Final   Gram Stain     Final   Value: ABUNDANT WBC PRESENT,BOTH PMN AND MONONUCLEAR     ABUNDANT GRAM NEGATIVE RODS   Report Status 12/29/2013 FINAL   Final    Studies/Results: Dg Abd Portable 1v  12/31/2013   CLINICAL DATA:  Feeding tube placement.  EXAM: PORTABLE ABDOMEN - 1 VIEW  COMPARISON:  None.  FINDINGS: A small bore feeding tube is identified with tip overlying the mid stomach.  The bowel gas pattern is unremarkable.  IMPRESSION: Small bore feeding tube with tip overlying the mid stomach.   Electronically Signed   By: Laveda Abbe M.D.   On: 12/31/2013 21:35  Assessment: She is improving on antibiotic therapy after repeated incision and drainage for a very complicated, deep Klebsiella neck infection.  Plan: 1. Continue IV ceftriaxone  Cliffton Asters, MD [facility] [phone] pager   [phone] cell 01/02/2014, 2:43 PM

## 2014-01-02 NOTE — Op Note (Signed)
NAMERIM, THATCH NO.:  [id number]  MEDICAL RECORD NO.:  [id number]  LOCATION:  PERIO                        FACILITY:  [facility]  PHYSICIAN:  Antony Contras, MD     DATE OF BIRTH:  01/05/1996  DATE OF PROCEDURE:  12/31/2013 DATE OF DISCHARGE:                              OPERATIVE REPORT   PREOPERATIVE DIAGNOSES: 1. Laryngeal edema. 2. Right neck abscess.  POSTOPERATIVE DIAGNOSES: 1. Laryngeal edema. 2. Right neck abscess.  PROCEDURE:  Awake tracheostomy.  SURGEON:  Antony Contras, MD  ANESTHESIA:  Local with 1% lidocaine with 1:100,000 epinephrine.  COMPLICATIONS:  None.  INDICATION:  The patient is an 18 year old female who developed a left parapharyngeal abscess at the beginning of February that was drained via tonsillectomy and internally it grew Klebsiella.  This was associated with uncontrolled diabetes.  She came back to the hospital late last week with right-sided neck swelling after completing a course of Augmentin and was found by CT to have separated nodes in the right neck and underwent incision and drainage by Dr. Lazarus Salines on Friday.  From then until today, she has had worsened sense of swelling inside her throat to the point that she is not able to swallow.  She has not had any airway distress.  A repeat CT scan demonstrated much worse edema of the supraglottic larynx on the right side and a fiberoptic laryngoscope confirmed this.  With increasing swelling involving the larynx and now the inability to swallow, I recommended proceeding with awake tracheostomy in order to stabilize her airway.  FINDINGS:  The trachea was slightly deviated to the left side.  The tissues were otherwise normal during the tracheostomy.  The right neck wound was irrigated in surgery with purulent fluid irrigated out.  DESCRIPTION OF PROCEDURE:  The patient was brought from her hospital room straight to the operating room after informed consent was  obtained including discussion of risks, benefits, and alternatives.  She was placed on the operative table in a beach chair position with a shoulder roll placed.  The anterior neck was examined and incision marked with a marking pen.  The neck was prepped and draped in sterile fashion keeping her face exposed.  The incision site was then injected with 1% lidocaine with 1:100,000 epinephrine.  The incision was made with a 15 blade scalpel through the skin and extended through the subcutaneous tissues using Bovie electrocautery down through the midline raphae of the strap muscles which were retracted on either side.  Additional lidocaine was added at times in order to help with pain control.  The thyroid isthmus was then encountered and was divided using Bovie electrocautery, and retracted either side.  The trachea was then injected with local anesthetic as well.  An incision was made between rings 2 and 3 of the trachea using 15 blade scalpel and extended to either side using scissors.  A stay suture of 2-0 silk suture was placed around the ring above and the ring below the tracheostomy site.  A #6 cuffed Shiley trach tube was then placed into the tracheostomy site easily and the cuff inflated.  The inner cannula was added after the obturator was removed and the anesthesia circuit  was attached to the tracheostomy tube.  She was then sedated by Anesthesia.  The stay sutures were tied with two knots above and 1 knot below the tracheostomy site.  The trach flange was then sutured to the skin using 2-0 silk suture in 4 quadrants.  At this point, the right neck wound was copiously irrigated using a red rubber catheter, extended into the wound and irrigated with saline.  The catheter was removed.  Of note, the neck dressing was removed at the beginning of the case.  At this point, the neck was cleaned off and a new Kerlix fluff dressing was applied to the neck after first applying the  tracheostomy dressing and Velcro trach tie.  A kangaroo tube was then placed to the left nasal passage and down the esophagus to about 60-65 cm and secured at the nose.  She was then returned to Anesthesia for wake-up, was moved to recovery room in stable condition.     Antony Contraswight D Luv Mish, MD     DDB/MEDQ  D:  12/31/2013  T:  01/01/2014  Job:  [id number]

## 2014-01-02 NOTE — Evaluation (Signed)
Passy-Muir Speaking Valve - Evaluation Patient Details  Name: [name] White MRN: [id number] Date of Birth: 05-Nov-1996  Today's Date: 01/02/2014 Time: 1400-1410 SLP Time Calculation (min): 10 min  Past Medical History:  Past Medical History  Diagnosis Date  . Diabetes mellitus without complication     had since she was 16   Past Surgical History:  Past Surgical History  Procedure Laterality Date  . Incision and drainage of peritonsillar abcess N/A 12/10/2013    Procedure: INCISION AND DRAINAGE OF PERITONSILLAR ABCESS;  Surgeon: Flo ShanksKarol Wolicki, MD;  [facility];  Service: ENT;  Laterality: N/A;  . Tonsillectomy Left 12/10/2013    Procedure: TONSILLECTOMY;  Surgeon: Flo ShanksKarol Wolicki, MD;  [facility];  Service: ENT;  Laterality: Left;  . Tonsillectomy  12/10/2013  . Mass biopsy Right 12/29/2013    Procedure: IRRIGATION AND DEBRIDEMENT RIGHT NECK   ;  Surgeon: Flo ShanksKarol Wolicki, MD;  [facility];  Service: ENT;  Laterality: Right;  . Tracheostomy tube placement N/A 12/31/2013    Procedure: TRACHEOSTOMY;  Surgeon: Christia Readingwight Bates, MD;  [facility];  Service: ENT;  Laterality: N/A;   HPI:  18 yo bf, DM, had LEFT parapharyngeal abscess I&D and LEFT tonsillectomy 1 FEB.  Abscess grew Klebsiella.  DM out of control at that time.  Pt claims to have taken entire discharge prescription. D/Cd  And feeling well until  relatively rapid onset RIGHT neck swelling and pain.  Seen in ER with 2 RIGHT Level II abscessed lymph nodes, largest 14 mm in longest axis.  Underwent incision and drainage of this right sided lymph node abscess 2/20. On 2/24, pt underwent bedside fiberoptic laryngoscopy showing marked edema of right supraglottic larynx obstructing view of glottis and half of larynx, underwent trach.  Now with #6 cuffed trach; cuff deflated upon arrival to room.    Assessment / Plan / Recommendation Clinical Impression  Initial PMSV assessment completed.  Cuff deflated at baseline.  Basic education  re: function of valve explained to pt and her mother - pt disengaged, mother had multiple questions.  Valve placed for limited intervals of only 1-2 respiratory cycles,  removed due to inability to access upper airway, no phonation.  Edema remains prohibitive at this time for use of valve.  Will return tomorrow to reassess; discussed with pt/mother.      SLP Assessment  Patient needs continued Speech Lanaguage Pathology Services       Frequency and Duration min 3x week  1 week       SLP Goals Potential to Achieve Goals: Good Potential Considerations: Cooperation/participation level   PMSV Trial  PMSV was placed for: brief intervals of 1-2 respiratory cycles Able to redirect subglottic air through upper airway: No Able to Attain Phonation: No Voice Quality: Aphonic Intelligibility: Unable to assess (comment)   Tracheostomy Tube    6 shiley cuffed   Vent Dependency  FiO2 (%): 28 %    Cuff Deflation Trial Cuff Deflation Trial - Comments: cuff deflated at baseline   Blenda MountsCouture, Mykael Batz Laurice 01/02/2014, 2:37 PM

## 2014-01-02 NOTE — Progress Notes (Signed)
Spoke to OR about flexible laryngoscope for 8am 2/25 procedure by Dr. Lazarus SalinesWolicki. There is only flexible laryngoscope in the hospital so nursing can pick up scope at 0630 in am on 2/25. Will pass this off during report to next floor.

## 2014-01-02 NOTE — Progress Notes (Signed)
Internal Medicine Attending  Date: 01/02/2014  Patient name: [name] SicksJuliana White Medical record number: [id number] Date of birth: 06-06-1996 Age: 18 y.o. Gender: female  I saw and evaluated the patient. I reviewed the resident's note by Dr. Yetta BarreJones and I agree with the resident's findings and plans as documented in his progress note.  [name] White was seen on rounds this AM.  She noted continued pain at the trach site.  Passy-Muir evaluation suggested continued laryngeal edema around deflated cuff which did not permit phonation.  Will continue IV ceftriaxone and reassess Passy-Muir valve utilization in the AM, anticipating resolution of edema over time.

## 2014-01-02 NOTE — Progress Notes (Signed)
Subjective: Patient seen at bedside this AM. No obvious complaints, in no acute distress. Pain adequately controlled, no respiratory distress, on trach collar. No fever, chills, nausea or vomiting.   Had some difficulty sleeping overnight, appeared a bit more drowsy this AM.   Objective: Vital signs in last 24 hours: Filed Vitals:   01/02/14 0034 01/02/14 0323 01/02/14 0443 01/02/14 0748  BP:  100/42    Pulse: 64 62 65   Temp:  98.9 F (37.2 C)  97.9 F (36.6 C)  TempSrc:  Oral  Oral  Resp: 12 14 12    Height:      Weight:      SpO2: 100% 100% 100%    Weight change:   Intake/Output Summary (Last 24 hours) at 01/02/14 1044 Last data filed at 01/02/14 0915  Gross per 24 hour  Intake   2153 ml  Output    250 ml  Net   1903 ml   Physical Exam: General: Alert, cooperative, NAD. On trach collar.  HEENT: PERRL, EOMI. Panda in place. Neck bandaged, appears clean, dry, intact.  Lungs: Normal work of respiration. No wheezes, rales, or rhonchi. Heart: RRR. No murmurs, gallops, or rubs. Abdomen: Soft, non-tender, non-distended. BS + Extremities: No cyanosis, clubbing, or edema Neurologic: Alert & oriented X3, no focal sensory or motor abnormalities  Lab Results: Basic Metabolic Panel:  Recent Labs Lab 01/01/14 0758 01/02/14 0225  NA 139 144  K 4.1 3.5*  CL 99 106  CO2 24 27  GLUCOSE 208* 162*  BUN 7 11  CREATININE 0.32* 0.40*  CALCIUM 9.2 8.7  MG  --  2.0   CBC:  Recent Labs Lab 12/31/13 1035 01/01/14 0300 01/02/14 0225  WBC 8.4 9.3 7.5  NEUTROABS 6.1 7.7  --   HGB 10.1* 10.1* 9.0*  HCT 31.3* 30.7* 28.0*  MCV 83.9 82.7 83.8  PLT 254 279 267   CBG:  Recent Labs Lab 01/01/14 1302 01/01/14 1708 01/01/14 1930 01/01/14 2303 01/02/14 0334 01/02/14 0754  GLUCAP 135* 129* 129* 173* 165* 144*   Micro Results: Recent Results (from the past 240 hour(s))  SURGICAL PCR SCREEN     Status: None   Collection Time    12/29/13  7:36 AM      Result Value Ref  Range Status   MRSA, PCR NEGATIVE  NEGATIVE Final   Staphylococcus aureus NEGATIVE  NEGATIVE Final   Comment:            The Xpert SA Assay (FDA     approved for NASAL specimens     in patients over 31 years of age),     is one component of     a comprehensive surveillance     program.  Test performance has     been validated by [employer] for patients greater     than or equal to 42 year old.     It is not intended     to diagnose infection nor to     guide or monitor treatment.  ANAEROBIC CULTURE     Status: None   Collection Time    12/29/13  9:37 AM      Result Value Ref Range Status   Specimen Description ABSCESS NECK RIGHT   Final   Special Requests PT ON UNISYN   Final   Gram Stain     Final   Value: NO WBC SEEN     NO SQUAMOUS EPITHELIAL CELLS SEEN  NO ORGANISMS SEEN     Performed at [facility]   Culture     Final   Value: NO ANAEROBES ISOLATED; CULTURE IN PROGRESS FOR 5 DAYS     Performed at [facility]   Report Status PENDING   Incomplete  FUNGUS CULTURE W SMEAR     Status: None   Collection Time    12/29/13  9:37 AM      Result Value Ref Range Status   Specimen Description ABSCESS NECK RIGHT   Final   Special Requests PT ON UNISYN   Final   Fungal Smear     Final   Value: NO YEAST OR FUNGAL ELEMENTS SEEN     Performed at [facility]   Culture     Final   Value: CULTURE IN PROGRESS FOR FOUR WEEKS     Performed at [facility]   Report Status PENDING   Incomplete  AFB CULTURE WITH SMEAR     Status: None   Collection Time    12/29/13  9:37 AM      Result Value Ref Range Status   Specimen Description ABSCESS NECK RIGHT   Final   Special Requests PT ON UNISYN   Final   ACID FAST SMEAR     Final   Value: NO ACID FAST BACILLI SEEN     Performed at [facility]   Culture     Final   Value: CULTURE WILL BE EXAMINED FOR 6 WEEKS BEFORE ISSUING A FINAL REPORT     Performed at [facility]   Report  Status PENDING   Incomplete  CULTURE, ROUTINE-ABSCESS     Status: None   Collection Time    12/29/13  9:37 AM      Result Value Ref Range Status   Specimen Description ABSCESS NECK RIGHT   Final   Special Requests PT ON UNISYN   Final   Gram Stain     Final   Value: ABUNDANT WBC PRESENT,BOTH PMN AND MONONUCLEAR     NO SQUAMOUS EPITHELIAL CELLS SEEN     ABUNDANT GRAM NEGATIVE RODS     Performed at [facility]     Performed at [facility]   Culture     Final   Value: MODERATE KLEBSIELLA PNEUMONIAE     Performed at [facility]   Report Status 12/31/2013 FINAL   Final   Organism ID, Bacteria KLEBSIELLA PNEUMONIAE   Final  GRAM STAIN     Status: None   Collection Time    12/29/13  9:37 AM      Result Value Ref Range Status   Specimen Description ABSCESS NECK RIGHT   Final   Special Requests PT ON UNISYN   Final   Gram Stain     Final   Value: ABUNDANT WBC PRESENT,BOTH PMN AND MONONUCLEAR     ABUNDANT GRAM NEGATIVE RODS   Report Status 12/29/2013 FINAL   Final   Medications: I have reviewed the patient's current medications. Scheduled Meds: . cefTRIAXone (ROCEPHIN)  IV  2 g Intravenous Q24H  . heparin subcutaneous  5,000 Units Subcutaneous 3 times per day  . insulin aspart  0-15 Units Subcutaneous Q4H  . insulin aspart protamine- aspart  16 Units Subcutaneous BID WC  . lidocaine   Other Once  . oxymetazoline  1 spray Each Nare BID   Continuous Infusions: . sodium chloride 100 mL/hr at 01/02/14 0316  . feeding supplement (GLUCERNA  1.2 CAL) 1,000 mL (01/02/14 0600)   PRN Meds:.diphenhydrAMINE, HYDROcodone-acetaminophen, morphine injection, promethazine, promethazine  Assessment/Plan: [name] is a 18 y.o. female w/ PMHx of uncontrolled DM type I (A1c 14%) and recent history of peritonsillar abscess s/p tonsillectomy and I&D, admitted for new right-sided lymph node abscess as seen by neck CT.  Abscess of lymph node of neck: Patient s/p I&D  of right lymph node abscess (12/29/13), culture positive for Klebsiella. S/p tracheostomy on 12/31/13, now on trach collar, in no acute distress. ABx as follows: Unasyn --> Cipro --> Rocephin. Patient continues to have no leukocytosis. ID consulted by ENT yesterday for ABx recommendations given worsened clinical picture on Unasyn previously. Switched to Rocephin IV yesterday.  -For repeat laryngoscopy on Wednesday or Thursday.  -Continue tube feeds; Glucerna -Hycet 7.5-325 (10-20 ml) q4h prn + Morphine prn for breakthrough -Phenergan prn for nausea  DM Type I: HbA1c as of 12/21/13, 14%. CBG's controlled. On 70/30 16 units bid. Panda in place, receiving tube feeds. -Continue Glucerna -Continue 70/30 16 units bid. If not adequately controlled, will switch to q8h 70/30. -SSI moderate -Repeat BMP in AM  DV PPx: Heparin [place]  Dispo: Disposition is deferred at this time, awaiting improvement of current medical problems.   The patient does have a current PCP (Jeanann Lewandowskylugbemiga Jegede, MD) and [facility] after discharge.  The patient does not have transportation limitations that hinder transportation to clinic appointments.  .Services Needed at time of discharge: Y = Yes, Blank = No PT:   OT:   RN:   Equipment:   Other:     LOS: 5 days   Courtney ParisEden W Cheveyo [place], MD 01/02/2014, 10:44 AM Pager: [phone]

## 2014-01-02 NOTE — Progress Notes (Signed)
Utilization review completed.  

## 2014-01-03 LAB — ANAEROBIC CULTURE: Gram Stain: NONE SEEN

## 2014-01-03 LAB — GLUCOSE, CAPILLARY
GLUCOSE-CAPILLARY: 86 mg/dL (ref 70–99)
GLUCOSE-CAPILLARY: 182 mg/dL — AB (ref 70–99)
GLUCOSE-CAPILLARY: 141 mg/dL — AB (ref 70–99)
Glucose-Capillary: 144 mg/dL — ABNORMAL HIGH (ref 70–99)
Glucose-Capillary: 145 mg/dL — ABNORMAL HIGH (ref 70–99)
Glucose-Capillary: 146 mg/dL — ABNORMAL HIGH (ref 70–99)

## 2014-01-03 LAB — BASIC METABOLIC PANEL
BUN: 5 mg/dL — ABNORMAL LOW (ref 6–23)
CALCIUM: 8.9 mg/dL (ref 8.4–10.5)
CO2: 27 mEq/L (ref 19–32)
Chloride: 102 mEq/L (ref 96–112)
Creatinine, Ser: 0.32 mg/dL — ABNORMAL LOW (ref 0.50–1.10)
Glucose, Bld: 146 mg/dL — ABNORMAL HIGH (ref 70–99)
Potassium: 3.9 mEq/L (ref 3.7–5.3)
Sodium: 141 mEq/L (ref 137–147)

## 2014-01-03 NOTE — Progress Notes (Signed)
Patient ID: [name] White, female   DOB: 10-17-1996, 18 y.o.   MRN: [id number]         [facility]    Date of Admission:  12/28/2013   Prior antibiotic therapy from 1/29-2/10/2014        Day 8 current antibiotic therapy        Day 3 ceftriaxone         Principal Problem:   Abscess of lymph node of neck Active Problems:   Parapharyngeal abscess   Diabetes mellitus type 1   Medically noncompliant   Dysphagia, oropharyngeal   Tracheostomy, acute management   . cefTRIAXone (ROCEPHIN)  IV  2 g Intravenous Q24H  . heparin subcutaneous  5,000 Units Subcutaneous 3 times per day  . insulin aspart  0-15 Units Subcutaneous Q4H  . insulin aspart protamine- aspart  16 Units Subcutaneous BID WC  . oxymetazoline  1 spray Each Nare BID   Subjective: She nods her head "yes" when I ask her if she is feeling better.  Objective: Temp:  [97.5 F (36.4 C)-99 F (37.2 C)] 98.7 F (37.1 C) (02/25 1038) Pulse Rate:  [68-97] 70 (02/25 1129) Resp:  [14-20] 18 (02/25 1129) BP: (100-131)/(53-68) 114/68 mmHg (02/25 1038) SpO2:  [98 %-100 %] 98 % (02/25 1129) FiO2 (%):  [28 %] 28 % (02/25 1129) Weight:  [63.3 kg (139 lb 8.8 oz)] 63.3 kg (139 lb 8.8 oz) (02/25 0523)  General: She is alert, sitting up in bed playing with a friend. She looks better. Lungs: Clear Cor: Regular S1 and S2 with no murmurs  Lab Results Lab Results  Component Value Date   WBC 7.5 01/02/2014   HGB 9.0* 01/02/2014   HCT 28.0* 01/02/2014   MCV 83.8 01/02/2014   PLT 267 01/02/2014    Lab Results  Component Value Date   CREATININE 0.32* 01/03/2014   BUN 5* 01/03/2014   NA 141 01/03/2014   K 3.9 01/03/2014   CL 102 01/03/2014   CO2 27 01/03/2014    Lab Results  Component Value Date   ALT 14 12/07/2013   AST 19 12/07/2013   ALKPHOS 153* 12/07/2013   BILITOT <0.2* 12/07/2013      Microbiology: Recent Results (from the past 240 hour(s))  SURGICAL PCR SCREEN     Status: None   Collection Time     12/29/13  7:36 AM      Result Value Ref Range Status   MRSA, PCR NEGATIVE  NEGATIVE Final   Staphylococcus aureus NEGATIVE  NEGATIVE Final   Comment:            The Xpert SA Assay (FDA     approved for NASAL specimens     in patients over 72 years of age),     is one component of     a comprehensive surveillance     program.  Test performance has     been validated by [employer] for patients greater     than or equal to 70 year old.     It is not intended     to diagnose infection nor to     guide or monitor treatment.  ANAEROBIC CULTURE     Status: None   Collection Time    12/29/13  9:37 AM      Result Value Ref Range Status   Specimen Description ABSCESS NECK RIGHT   Final   Special Requests PT ON UNISYN  Final   Gram Stain     Final   Value: NO WBC SEEN     NO SQUAMOUS EPITHELIAL CELLS SEEN     NO ORGANISMS SEEN     [facility]formed at [facility]   Culture     Final   Value: NO ANAEROBES ISOLATED; CULTURE IN PROGRESS FOR 5 DAYS     [facility]formed at [facility]   Report Status PENDING   Incomplete  FUNGUS CULTURE W SMEAR     Status: None   Collection Time    12/29/13  9:37 AM      Result Value Ref Range Status   Specimen Description ABSCESS NECK RIGHT   Final   Special Requests PT ON UNISYN   Final   Fungal Smear     Final   Value: NO YEAST OR FUNGAL ELEMENTS SEEN     [facility]formed at [facility]   Culture     Final   Value: CULTURE IN PROGRESS FOR FOUR WEEKS     Performed at [facility]   Report Status PENDING   Incomplete  AFB CULTURE WITH SMEAR     Status: None   Collection Time    12/29/13  9:37 AM      Result Value Ref Range Status   Specimen Description ABSCESS NECK RIGHT   Final   Special Requests PT ON UNISYN   Final   ACID FAST SMEAR     Final   Value: NO ACID FAST BACILLI SEEN     Performed at [facility]   Culture     Final   Value: CULTURE WILL BE EXAMINED FOR 6 WEEKS BEFORE ISSUING A FINAL REPORT      Performed at [facility]   Report Status PENDING   Incomplete  CULTURE, ROUTINE-ABSCESS     Status: None   Collection Time    12/29/13  9:37 AM      Result Value Ref Range Status   Specimen Description ABSCESS NECK RIGHT   Final   Special Requests PT ON UNISYN   Final   Gram Stain     Final   Value: ABUNDANT WBC PRESENT,BOTH PMN AND MONONUCLEAR     NO SQUAMOUS EPITHELIAL CELLS SEEN     ABUNDANT GRAM NEGATIVE RODS     [facility]     [facility]   Culture     Final   Value: MODERATE KLEBSIELLA PNEUMONIAE     Performed at [facility]   Report Status 12/31/2013 FINAL   Final   Organism ID, Bacteria KLEBSIELLA PNEUMONIAE   Final  GRAM STAIN     Status: None   Collection Time    12/29/13  9:37 AM      Result Value Ref Range Status   Specimen Description ABSCESS NECK RIGHT   Final   Special Requests PT ON UNISYN   Final   Gram Stain     Final   Value: ABUNDANT WBC PRESENT,BOTH PMN AND MONONUCLEAR     ABUNDANT GRAM NEGATIVE RODS   Report Status 12/29/2013 FINAL   Final    Studies/Results: No results found.  Assessment: She is improving slowly on antibiotic therapy for a very complicated, deep Klebsiella neck infection.  Plan: 1. Continue IV ceftriaxone  Cliffton AstersJohn Adison Reifsteck, MD [facility] [phone] pager   [phone] cell 01/03/2014, 12:05 PM

## 2014-01-03 NOTE — Evaluation (Signed)
Clinical/Bedside Swallow Evaluation Patient Details  Name: [name] White MRN: [id number] Date of Birth: Mar 29, 1996  Today's Date: 01/03/2014 Time: [id number] SLP Time Calculation (min): 10 min  Past Medical History:  Past Medical History  Diagnosis Date  . Diabetes mellitus without complication     had since she was 16   Past Surgical History:  Past Surgical History  Procedure Laterality Date  . Incision and drainage of peritonsillar abcess N/A 12/10/2013    Procedure: INCISION AND DRAINAGE OF PERITONSILLAR ABCESS;  Surgeon: Flo ShanksKarol Wolicki, MD;  [facility];  Service: ENT;  Laterality: N/A;  . Tonsillectomy Left 12/10/2013    Procedure: TONSILLECTOMY;  Surgeon: Flo ShanksKarol Wolicki, MD;  [facility];  Service: ENT;  Laterality: Left;  . Tonsillectomy  12/10/2013  . Mass biopsy Right 12/29/2013    Procedure: IRRIGATION AND DEBRIDEMENT RIGHT NECK   ;  Surgeon: Flo ShanksKarol Wolicki, MD;  [facility];  Service: ENT;  Laterality: Right;  . Tracheostomy tube placement N/A 12/31/2013    Procedure: TRACHEOSTOMY;  Surgeon: Christia Readingwight Bates, MD;  [facility];  Service: ENT;  Laterality: N/A;   HPI:  18 yo bf, DM, had LEFT parapharyngeal abscess I&D and LEFT tonsillectomy 1 FEB.  Abscess grew Klebsiella.  DM out of control at that time.  Pt claims to have taken entire discharge prescription.  Feeling well until relatively rapid onset RIGHT neck swelling and pain.  Seen in ER with 2 RIGHT Level II abscessed lymph nodes, largest 14mm in longest axis.  Underwent incision and drainage of this right sided lymph node abscess 2/20.   On 2/24, pt underwent bedside fiberoptic laryngoscopy showing marked edema of right supraglottic larynx obstructing view of glottis and half of larynx.  Trached same date.  Downsized trach today to #4 cuffless; pt achieving leak speech.     Assessment / Plan / Recommendation Clinical Impression  Bedside swallow eval completed: pt using PMSV successfully during assessment.  Much  more interactive today.  Presents with functional swallow: adequate mastication, swift swallow trigger, and no indication of pharyngeal pooling nor aspiration.  Strong phonation and cough present when cued.  Pt continues to complain of odynophagia (7/10 initially) - prefers to begin soft foods, liquids initially.    Recommend pureed diet, thin liquids, meds crushed in puree.  Consider removal of NG.         Diet Recommendation Dysphagia 1 (Puree);Thin liquid   Liquid Administration via: Cup;Straw Medication Administration: Crushed with puree Supervision: Patient able to self feed Compensations: Slow rate;Small sips/bites Postural Changes and/or Swallow Maneuvers: Seated upright 90 degrees    Other  Recommendations     Follow Up Recommendations  None     Swallow Study Prior Functional Status       General HPrevious Swallow Assessment: none Diet Prior to this Study: NPO;Panda Temperature Spikes Noted: No Respiratory Status: Trach Trach Size and Type: #4;Uncuffed Behavior/Cognition: Alert;Cooperative;Pleasant mood Oral Cavity - Dentition: Adequate natural dentition Self-Feeding Abilities: Able to feed self Patient Positioning: Upright in bed Baseline Vocal Quality: Clear Volitional Cough: Strong Volitional Swallow: Able to elicit    Oral/Motor/Sensory Function Overall Oral Motor/Sensory Function: Appears within functional limits for tasks assessed   Ice Chips Ice chips: Within functional limits Presentation: Self Fed;Spoon   Thin Liquid Thin Liquid: Within functional limits Presentation: Cup    Nectar Thick Nectar Thick Liquid: Not tested   Honey Thick Honey Thick Liquid: Not tested   Puree Puree: Within functional limits Presentation: Self Fed;Spoon   Solid  [name] White L. [place], [place] CCC/SLP Pager [phone]     Solid: Not tested       [name] White 01/03/2014,1:48 PM

## 2014-01-03 NOTE — Progress Notes (Signed)
Speech Language Pathology Treatment: [name] White Speaking valve  Patient Details Name: [name] White MRN: [id number] DOB: Mar 13, 1996 Today's Date: 01/03/2014 Time: [id number] SLP Time Calculation (min): 10 min  Assessment / Plan / Recommendation Clinical Impression         Pt's trach downsized to #4 cuffless today; attaining leak speech.  Excellent success with PMSV trial - use of valve improves efficiency of phonation, though pt is certainly able to speak without it.  Taught pt's mother how to place/remove valve; cautioned to remove valve if SP02 falls below 90%, but this is unlikely.  Pt for probable decannulation in the next few days.   Recommend use of valve during all waking hours.     HPI HPI: 18 yo bf, DM, had LEFT parapharyngeal abscess I&D and LEFT tonsillectomy 1 FEB.  Abscess grew Klebsiella.  DM out of control at that time.  Pt claims to have taken entire discharge prescription.  Feeling well until 2 days ago with relatively rapid onset RIGHT neck swelling and pain.  Seen in ER with 2 RIGHT Level II abscessed lymph nodes, largest 14mm in longest axis.  Underwent incision and drainage of this right sided lymph node abscess 2/20.  On 2/24, pt underwent bedside fiberoptic laryngoscopy showing marked edema of right supraglottic larynx obstructing view of glottis and half of larynx; trached same day.     Pertinent Vitals SP02 at 100%  SLP Plan  Continue with current plan of care    Recommendations       Patient may use Passy-White Speech Valve: During all waking hours (remove during sleep)       Follow up Recommendations: None Plan: Continue with current plan of care        [name] White [name] White, [place] CCC/SLP Pager [phone]  [name] White, [name] White 01/03/2014, 1:57 PM

## 2014-01-03 NOTE — Progress Notes (Signed)
Subjective: Patient seen at bedside this AM. No obvious complaints, in no acute distress. Says she is feeling okay, no pain today. Slept okay. SLP to work w/ patient w/ Passey-Muir valve today. No nausea, vomiting, fever, chills.   Will likely have trach removed in 2-3 days.   Objective: Vital signs in last 24 hours: Filed Vitals:   01/03/14 0523 01/03/14 0839 01/03/14 1038 01/03/14 1129  BP: 112/56  114/68   Pulse: 72 75 73 70  Temp: 99 F (37.2 C)  98.7 F (37.1 C)   TempSrc: Oral  Oral   Resp: 14 16 18 18   Height:      Weight: 139 lb 8.8 oz (63.3 kg)     SpO2: 100% 100% 100% 98%   Weight change: -3.5 oz (-0.1 kg)  Intake/Output Summary (Last 24 hours) at 01/03/14 1328 Last data filed at 01/03/14 0900  Gross per 24 hour  Intake    770 ml  Output    300 ml  Net    470 ml   Physical Exam: General: Alert, cooperative, NAD. On trach collar.  HEENT: PERRL, EOMI. Panda in place. Neck bandaged, appears clean, dry, intact.  Lungs: Normal work of respiration. No wheezes, rales, or rhonchi. Heart: RRR. No murmurs, gallops, or rubs. Abdomen: Soft, non-tender, non-distended. BS + Extremities: No cyanosis, clubbing, or edema Neurologic: Alert & oriented X3, no focal sensory or motor abnormalities  Lab Results: Basic Metabolic Panel:  Recent Labs Lab 01/02/14 0225 01/03/14 0331  NA 144 141  K 3.5* 3.9  CL 106 102  CO2 27 27  GLUCOSE 162* 146*  BUN 11 5*  CREATININE 0.40* 0.32*  CALCIUM 8.7 8.9  MG 2.0  --    CBC:  Recent Labs Lab 12/31/13 1035 01/01/14 0300 01/02/14 0225  WBC 8.4 9.3 7.5  NEUTROABS 6.1 7.7  --   HGB 10.1* 10.1* 9.0*  HCT 31.3* 30.7* 28.0*  MCV 83.9 82.7 83.8  PLT 254 279 267   CBG:  Recent Labs Lab 01/02/14 1758 01/02/14 2001 01/03/14 0051 01/03/14 0352 01/03/14 0738 01/03/14 1203  GLUCAP 190* 118* 146* 144* 145* 86   Micro Results: Recent Results (from the past 240 hour(s))  SURGICAL PCR SCREEN     Status: None   Collection  Time    12/29/13  7:36 AM      Result Value Ref Range Status   MRSA, PCR NEGATIVE  NEGATIVE Final   Staphylococcus aureus NEGATIVE  NEGATIVE Final   Comment:            The Xpert SA Assay (FDA     approved for NASAL specimens     in patients over 10 years of age),     is one component of     a comprehensive surveillance     program.  Test performance has     been validated by [employer] for patients greater     than or equal to 59 year old.     It is not intended     to diagnose infection nor to     guide or monitor treatment.  ANAEROBIC CULTURE     Status: None   Collection Time    12/29/13  9:37 AM      Result Value Ref Range Status   Specimen Description ABSCESS NECK RIGHT   Final   Special Requests PT ON UNISYN   Final   Gram Stain     Final  Value: NO WBC SEEN     NO SQUAMOUS EPITHELIAL CELLS SEEN     NO ORGANISMS SEEN     Performed at [facility]   Culture     Final   Value: NO ANAEROBES ISOLATED     Performed at [facility]   Report Status 01/03/2014 FINAL   Final  FUNGUS CULTURE W SMEAR     Status: None   Collection Time    12/29/13  9:37 AM      Result Value Ref Range Status   Specimen Description ABSCESS NECK RIGHT   Final   Special Requests PT ON UNISYN   Final   Fungal Smear     Final   Value: NO YEAST OR FUNGAL ELEMENTS SEEN     Performed at [facility]   Culture     Final   Value: CULTURE IN PROGRESS FOR FOUR WEEKS     Performed at [facility]   Report Status PENDING   Incomplete  AFB CULTURE WITH SMEAR     Status: None   Collection Time    12/29/13  9:37 AM      Result Value Ref Range Status   Specimen Description ABSCESS NECK RIGHT   Final   Special Requests PT ON UNISYN   Final   ACID FAST SMEAR     Final   Value: NO ACID FAST BACILLI SEEN     Performed at [facility]   Culture     Final   Value: CULTURE WILL BE EXAMINED FOR 6 WEEKS BEFORE ISSUING A FINAL REPORT     Performed at [facility]   Report Status PENDING   Incomplete  CULTURE, ROUTINE-ABSCESS     Status: None   Collection Time    12/29/13  9:37 AM      Result Value Ref Range Status   Specimen Description ABSCESS NECK RIGHT   Final   Special Requests PT ON UNISYN   Final   Gram Stain     Final   Value: ABUNDANT WBC PRESENT,BOTH PMN AND MONONUCLEAR     NO SQUAMOUS EPITHELIAL CELLS SEEN     ABUNDANT GRAM NEGATIVE RODS     [facility]     [facility]formed at [facility]   Culture     Final   Value: MODERATE KLEBSIELLA PNEUMONIAE     Performed at [facility]   Report Status 12/31/2013 FINAL   Final   Organism ID, Bacteria KLEBSIELLA PNEUMONIAE   Final  GRAM STAIN     Status: None   Collection Time    12/29/13  9:37 AM      Result Value Ref Range Status   Specimen Description ABSCESS NECK RIGHT   Final   Special Requests PT ON UNISYN   Final   Gram Stain     Final   Value: ABUNDANT WBC PRESENT,BOTH PMN AND MONONUCLEAR     ABUNDANT GRAM NEGATIVE RODS   Report Status 12/29/2013 FINAL   Final   Medications: I have reviewed the patient's current medications. Scheduled Meds: . cefTRIAXone (ROCEPHIN)  IV  2 g Intravenous Q24H  . heparin subcutaneous  5,000 Units Subcutaneous 3 times per day  . insulin aspart  0-15 Units Subcutaneous Q4H  . insulin aspart protamine- aspart  16 Units Subcutaneous BID WC  . oxymetazoline  1 spray Each Nare BID   Continuous Infusions: . sodium chloride 75 mL/hr at 01/03/14 0500  .  feeding supplement (GLUCERNA 1.2 CAL) 1,000 mL (01/03/14 0500)   PRN Meds:.diphenhydrAMINE, HYDROcodone-acetaminophen, morphine injection, promethazine, promethazine  Assessment/Plan: [name] is a 18 y.o. female w/ PMHx of uncontrolled DM type I (A1c 14%) and recent history of peritonsillar abscess s/p tonsillectomy and I&D, admitted for new right-sided lymph node abscess as seen by neck CT. S/p tracheostomy 2/22 for laryngeal edema.   Abscess of  lymph node of neck: Patient s/p I&D of right lymph node abscess (12/29/13), culture positive for Klebsiella. S/p tracheostomy on 12/31/13, on trach collar, in no acute distress. Repeat laryngoscopy as per ENT notes showed much improved edema and mobile cords. Will likely de-cannulate in 2-3 days. -Continue tube feeds; Glucerna -Hycet 7.5-325 (10-20 ml) q4h prn + Morphine prn for breakthrough -Phenergan prn for nausea -Changed to cuffless trach today.   DM Type I: HbA1c as of 12/21/13, 14%. CBG's controlled. On 70/30 16 units bid. Panda in place, receiving tube feeds. -Continue Glucerna -Continue 70/30 16 units bid -SSI moderate  DV PPx: Heparin [place]  Dispo: Disposition is deferred at this time, awaiting improvement of current medical problems.   The patient does have a current PCP (Jeanann Lewandowsky, MD) and does not need an [facility] hospital follow-up appointment after discharge.  The patient does not have transportation limitations that hinder transportation to clinic appointments.  .Services Needed at time of discharge: Y = Yes, Blank = No PT:   OT:   RN:   Equipment:   Other:     LOS: 6 days   Courtney Paris, MD 01/03/2014, 1:28 PM Pager: [phone]

## 2014-01-03 NOTE — Progress Notes (Signed)
01/03/2014 9:50 AM  [name], Romesha [id number]  Post-Op Day 3/5    Temp:  [97.5 F (36.4 C)-99 F (37.2 C)] 99 F (37.2 C) (02/25 0523) Pulse Rate:  [68-97] 75 (02/25 0839) Resp:  [14-20] 16 (02/25 0839) BP: (100-131)/(53-62) 112/56 mmHg (02/25 0523) SpO2:  [98 %-100 %] 100 % (02/25 0839) FiO2 (%):  [28 %] 28 % (02/25 0839) Weight:  [63.3 kg (139 lb 8.8 oz)] 63.3 kg (139 lb 8.8 oz) (02/25 0523),     Intake/Output Summary (Last 24 hours) at 01/03/14 0950 Last data filed at 01/03/14 0900  Gross per 24 hour  Intake    790 ml  Output    700 ml  Net     90 ml    Results for orders placed during the hospital encounter of 12/28/13 (from the past 24 hour(s))  GLUCOSE, CAPILLARY     Status: Abnormal   Collection Time    01/02/14 12:40 PM      Result Value Ref Range   Glucose-Capillary 114 (*) 70 - 99 mg/dL  GLUCOSE, CAPILLARY     Status: Abnormal   Collection Time    01/02/14  5:58 PM      Result Value Ref Range   Glucose-Capillary 190 (*) 70 - 99 mg/dL  GLUCOSE, CAPILLARY     Status: Abnormal   Collection Time    01/02/14  8:01 PM      Result Value Ref Range   Glucose-Capillary 118 (*) 70 - 99 mg/dL   Comment 1 Notify RN     Comment 2 Documented in Chart    GLUCOSE, CAPILLARY     Status: Abnormal   Collection Time    01/03/14 12:51 AM      Result Value Ref Range   Glucose-Capillary 146 (*) 70 - 99 mg/dL  BASIC METABOLIC PANEL     Status: Abnormal   Collection Time    01/03/14  3:31 AM      Result Value Ref Range   Sodium 141  137 - 147 mEq/L   Potassium 3.9  3.7 - 5.3 mEq/L   Chloride 102  96 - 112 mEq/L   CO2 27  19 - 32 mEq/L   Glucose, Bld 146 (*) 70 - 99 mg/dL   BUN 5 (*) 6 - 23 mg/dL   Creatinine, Ser 7.820.32 (*) 0.50 - 1.10 mg/dL   Calcium 8.9  8.4 - 95.610.5 mg/dL   GFR calc non Af Amer >90  >90 mL/min   GFR calc Af Amer >90  >90 mL/min  GLUCOSE, CAPILLARY     Status: Abnormal   Collection Time    01/03/14  3:52 AM      Result Value Ref Range   Glucose-Capillary 144 (*) 70 - 99 mg/dL   Comment 1 Notify RN    GLUCOSE, CAPILLARY     Status: Abnormal   Collection Time    01/03/14  7:38 AM      Result Value Ref Range   Glucose-Capillary 145 (*) 70 - 99 mg/dL   Comment 1 Notify RN     Using 2% viscous xylocaine per RIGHT nose, flexible laryngoscopy was performed with informed consent.  Much reduced laryngeal edema.  Vocal cords mobile.  Airway good.  SUBJECTIVE:  Speech Path eval.  Inadequate airway for PMV.  I do not see where a swallowing eval was done.  Still mod purulent drainage RIGHT neck wound. Reports less pain with neck motion.    OBJECTIVE:  Neck still  somewhat swollen and tender. Purulent drainage.  Larynx improved as above.  IMPRESSION:  Slow improvement of neck drainage, overall picture.  Less laryngeal edema.  PLAN:  Changed trach to #4 cuffless Shiley. Was able to phonate easily with finger occlusion.  Speech Path to work on PMV and swallowing.  May be ready to decannulate in 2-3 days.  Flo Shanks

## 2014-01-03 NOTE — Progress Notes (Signed)
Internal Medicine Attending  Date: 01/03/2014  Patient name: [name] SicksJuliana Skaggs Medical record number: [id number] Date of birth: December 05, 1995 Age: 18 y.o. Gender: female  I saw and evaluated the patient. I reviewed the resident's note by Dr. Yetta BarreJones and I agree with the resident's findings and plans as documented in his progress note.  [name] was feeling better when seen on rounds today.  ENT found less edema upon laryngoscopy and Speech Therapy notes leak speech with the downsized tube.  We are continuing IV ceftriaxone, managing her blood sugars (which are markedly improved), and deferring management of the trach to ENT.

## 2014-01-04 ENCOUNTER — Other Ambulatory Visit: Payer: Medicaid Other

## 2014-01-04 LAB — GLUCOSE, CAPILLARY
GLUCOSE-CAPILLARY: 114 mg/dL — AB (ref 70–99)
Glucose-Capillary: 130 mg/dL — ABNORMAL HIGH (ref 70–99)
Glucose-Capillary: 180 mg/dL — ABNORMAL HIGH (ref 70–99)
Glucose-Capillary: 184 mg/dL — ABNORMAL HIGH (ref 70–99)
Glucose-Capillary: 325 mg/dL — ABNORMAL HIGH (ref 70–99)
Glucose-Capillary: 94 mg/dL (ref 70–99)

## 2014-01-04 NOTE — Progress Notes (Signed)
Patient ID: [name] White, female   DOB: 20-Aug-1996, 18 y.o.   MRN: [id number]         [facility]    Date of Admission:  12/28/2013   Prior antibiotic therapy from 1/29-2/10/2014        Day 9 current antibiotic therapy        Day 4 ceftriaxone         Principal Problem:   Abscess of lymph node of neck Active Problems:   Parapharyngeal abscess   Diabetes mellitus type 1   Medically noncompliant   Dysphagia, oropharyngeal   Tracheostomy, acute management   . cefTRIAXone (ROCEPHIN)  IV  2 g Intravenous Q24H  . heparin subcutaneous  5,000 Units Subcutaneous 3 times per day  . insulin aspart  0-15 Units Subcutaneous Q4H  . insulin aspart protamine- aspart  16 Units Subcutaneous BID WC  . oxymetazoline  1 spray Each Nare BID   Subjective: She is feeling better.  Objective: Temp:  [98.3 F (36.8 C)-98.8 F (37.1 C)] 98.3 F (36.8 C) (02/26 0551) Pulse Rate:  [64-78] 74 (02/26 1329) Resp:  [14-18] 14 (02/26 1329) BP: (126-137)/(65-80) 137/80 mmHg (02/26 0551) SpO2:  [99 %-100 %] 100 % (02/26 0936) FiO2 (%):  [28 %] 28 % (02/26 1329) Weight:  [61.6 kg (135 lb 12.9 oz)] 61.6 kg (135 lb 12.9 oz) (02/26 0551)  General: She is alert. She looks better. Lungs: Clear Cor: Regular S1 and S2 with no murmurs  Lab Results Lab Results  Component Value Date   WBC 7.5 01/02/2014   HGB 9.0* 01/02/2014   HCT 28.0* 01/02/2014   MCV 83.8 01/02/2014   PLT 267 01/02/2014    Lab Results  Component Value Date   CREATININE 0.32* 01/03/2014   BUN 5* 01/03/2014   NA 141 01/03/2014   K 3.9 01/03/2014   CL 102 01/03/2014   CO2 27 01/03/2014    Lab Results  Component Value Date   ALT 14 12/07/2013   AST 19 12/07/2013   ALKPHOS 153* 12/07/2013   BILITOT <0.2* 12/07/2013      Microbiology: Recent Results (from the past 240 hour(s))  SURGICAL PCR SCREEN     Status: None   Collection Time    12/29/13  7:36 AM      Result Value Ref Range Status   MRSA, PCR NEGATIVE   NEGATIVE Final   Staphylococcus aureus NEGATIVE  NEGATIVE Final   Comment:            The Xpert SA Assay (FDA     approved for NASAL specimens     in patients over 35 years of age),     is one component of     a comprehensive surveillance     program.  Test performance has     been validated by [employer] for patients greater     than or equal to 4 year old.     It is not intended     to diagnose infection nor to     guide or monitor treatment.  ANAEROBIC CULTURE     Status: None   Collection Time    12/29/13  9:37 AM      Result Value Ref Range Status   Specimen Description ABSCESS NECK RIGHT   Final   Special Requests PT ON UNISYN   Final   Gram Stain     Final   Value: NO WBC SEEN  NO SQUAMOUS EPITHELIAL CELLS SEEN     NO ORGANISMS SEEN     Performed at [facility]   Culture     Final   Value: NO ANAEROBES ISOLATED     Performed at [facility]   Report Status 01/03/2014 FINAL   Final  FUNGUS CULTURE W SMEAR     Status: None   Collection Time    12/29/13  9:37 AM      Result Value Ref Range Status   Specimen Description ABSCESS NECK RIGHT   Final   Special Requests PT ON UNISYN   Final   Fungal Smear     Final   Value: NO YEAST OR FUNGAL ELEMENTS SEEN     Performed at [facility]   Culture     Final   Value: CULTURE IN PROGRESS FOR FOUR WEEKS     Performed at [facility]   Report Status PENDING   Incomplete  AFB CULTURE WITH SMEAR     Status: None   Collection Time    12/29/13  9:37 AM      Result Value Ref Range Status   Specimen Description ABSCESS NECK RIGHT   Final   Special Requests PT ON UNISYN   Final   ACID FAST SMEAR     Final   Value: NO ACID FAST BACILLI SEEN     Performed at [facility]   Culture     Final   Value: CULTURE WILL BE EXAMINED FOR 6 WEEKS BEFORE ISSUING A FINAL REPORT     Performed at [facility]   Report Status PENDING   Incomplete  CULTURE, ROUTINE-ABSCESS     Status:  None   Collection Time    12/29/13  9:37 AM      Result Value Ref Range Status   Specimen Description ABSCESS NECK RIGHT   Final   Special Requests PT ON UNISYN   Final   Gram Stain     Final   Value: ABUNDANT WBC PRESENT,BOTH PMN AND MONONUCLEAR     NO SQUAMOUS EPITHELIAL CELLS SEEN     ABUNDANT GRAM NEGATIVE RODS     [facility]     [facility] at [facility]   Culture     Final   Value: MODERATE KLEBSIELLA PNEUMONIAE     Performed at [facility]   Report Status 12/31/2013 FINAL   Final   Organism ID, Bacteria KLEBSIELLA PNEUMONIAE   Final  GRAM STAIN     Status: None   Collection Time    12/29/13  9:37 AM      Result Value Ref Range Status   Specimen Description ABSCESS NECK RIGHT   Final   Special Requests PT ON UNISYN   Final   Gram Stain     Final   Value: ABUNDANT WBC PRESENT,BOTH PMN AND MONONUCLEAR     ABUNDANT GRAM NEGATIVE RODS   Report Status 12/29/2013 FINAL   Final    Studies/Results: No results found.  Assessment: She is improving slowly on antibiotic therapy for a very complicated, deep Klebsiella neck infection. I discussed her situation with Dr. Cloria SpringWoliki. I would switch her back to oral amoxicillin clavulanate 875 mg twice daily upon discharge and plan on treating for at least 12 more days. It will be very important for her to followup with Dr. Cloria SpringWoliki for clinical evaluation and care.  Plan: 1. Change back to oral amoxicillin clavulanate 875 mg  twice daily upon discharge and treat at least 12 more days (she should followup with Dr. Cloria Spring before completing the antibiotics) 2. I will sign off now but please call if I can be of further assistance  Cliffton Asters, MD [facility] [phone] pager   [phone] cell 01/04/2014, 2:27 PM

## 2014-01-04 NOTE — Progress Notes (Signed)
01/04/2014 2:04 PM  [name] White, [name] White [id number]  Post-Op Day 4/6    Temp:  [97.2 F (36.2 C)-98.8 F (37.1 C)] 98.3 F (36.8 C) (02/26 0551) Pulse Rate:  [64-78] 74 (02/26 1329) Resp:  [14-18] 14 (02/26 1329) BP: (126-144)/(65-80) 137/80 mmHg (02/26 0551) SpO2:  [99 %-100 %] 100 % (02/26 0936) FiO2 (%):  [28 %] 28 % (02/26 1329) Weight:  [61.6 kg (135 lb 12.9 oz)] 61.6 kg (135 lb 12.9 oz) (02/26 0551),     Intake/Output Summary (Last 24 hours) at 01/04/14 1404 Last data filed at 01/04/14 0358  Gross per 24 hour  Intake      0 ml  Output    700 ml  Net   -700 ml    Results for orders placed during the hospital encounter of 12/28/13 (from the past 24 hour(s))  GLUCOSE, CAPILLARY     Status: Abnormal   Collection Time    01/03/14  4:06 PM      Result Value Ref Range   Glucose-Capillary 182 (*) 70 - 99 mg/dL  GLUCOSE, CAPILLARY     Status: Abnormal   Collection Time    01/03/14  7:49 PM      Result Value Ref Range   Glucose-Capillary 141 (*) 70 - 99 mg/dL  GLUCOSE, CAPILLARY     Status: Abnormal   Collection Time    01/03/14 11:52 PM      Result Value Ref Range   Glucose-Capillary 130 (*) 70 - 99 mg/dL  GLUCOSE, CAPILLARY     Status: Abnormal   Collection Time    01/04/14  3:55 AM      Result Value Ref Range   Glucose-Capillary 114 (*) 70 - 99 mg/dL  GLUCOSE, CAPILLARY     Status: Abnormal   Collection Time    01/04/14  8:53 AM      Result Value Ref Range   Glucose-Capillary 184 (*) 70 - 99 mg/dL   Comment 1 Documented in Chart     Comment 2 Notify RN    GLUCOSE, CAPILLARY     Status: Abnormal   Collection Time    01/04/14 12:34 PM      Result Value Ref Range   Glucose-Capillary 180 (*) 70 - 99 mg/dL   Comment 1 Documented in Chart     Comment 2 Notify RN      SUBJECTIVE:  Breathing, talking, swallowing with trach open or with PMV.  RIGHT neck still firm and somewhat but less tender  OBJECTIVE:  Mod purulent drainage RIGHT neck.  Breathing well with  trach finger occluded.  Trach removed.  IMPRESSION:  Satisfactory check.  DM better controlled.  Trach removed.    PLAN:  Cont abx.  Probably ready for discharge home tomorrow from my standpoint.  Small bolus dressing over trach site until no longer leaking air or mucus.   Flo ShanksWOLICKI, [name]

## 2014-01-04 NOTE — Progress Notes (Signed)
Subjective: Patient seen at bedside this AM. No complaints. No fever or chills. Blood sugars controlled.   Trach de-cannulated today after seen this AM, no issues. Stable for discharge tomorrow per ENT.  Objective: Vital signs in last 24 hours: Filed Vitals:   01/04/14 0021 01/04/14 0551 01/04/14 0610 01/04/14 0936  BP:  137/80    Pulse: 64 67 70   Temp:  98.3 F (36.8 C)    TempSrc:  Oral    Resp: 16  16   Height:      Weight:  135 lb 12.9 oz (61.6 kg)    SpO2: 100% 100% 99% 100%   Weight change: -3 lb 12 oz (-1.7 kg)  Intake/Output Summary (Last 24 hours) at 01/04/14 1210 Last data filed at 01/04/14 0358  Gross per 24 hour  Intake      0 ml  Output    700 ml  Net   -700 ml   Physical Exam: General: Alert, cooperative, NAD. On trach collar.  HEENT: PERRL, EOMI. Panda in place. Neck bandaged, appears clean, dry, intact.  Lungs: Normal work of respiration. No wheezes, rales, or rhonchi. Heart: RRR. No murmurs, gallops, or rubs. Abdomen: Soft, non-tender, non-distended. BS + Extremities: No cyanosis, clubbing, or edema Neurologic: Alert & oriented X3, no focal sensory or motor abnormalities  Lab Results: Basic Metabolic Panel:  Recent Labs Lab 01/02/14 0225 01/03/14 0331  NA 144 141  K 3.5* 3.9  CL 106 102  CO2 27 27  GLUCOSE 162* 146*  BUN 11 5*  CREATININE 0.40* 0.32*  CALCIUM 8.7 8.9  MG 2.0  --    CBC:  Recent Labs Lab 12/31/13 1035 01/01/14 0300 01/02/14 0225  WBC 8.4 9.3 7.5  NEUTROABS 6.1 7.7  --   HGB 10.1* 10.1* 9.0*  HCT 31.3* 30.7* 28.0*  MCV 83.9 82.7 83.8  PLT 254 279 267   CBG:  Recent Labs Lab 01/03/14 1203 01/03/14 1606 01/03/14 1949 01/03/14 2352 01/04/14 0355 01/04/14 0853  GLUCAP 86 182* 141* 130* 114* 184*   Micro Results: Recent Results (from the past 240 hour(s))  SURGICAL PCR SCREEN     Status: None   Collection Time    12/29/13  7:36 AM      Result Value Ref Range Status   MRSA, PCR NEGATIVE  NEGATIVE  Final   Staphylococcus aureus NEGATIVE  NEGATIVE Final   Comment:            The Xpert SA Assay (FDA     approved for NASAL specimens     in patients over 97 years of age),     is one component of     a comprehensive surveillance     program.  Test performance has     been validated by [employer] for patients greater     than or equal to 38 year old.     It is not intended     to diagnose infection nor to     guide or monitor treatment.  ANAEROBIC CULTURE     Status: None   Collection Time    12/29/13  9:37 AM      Result Value Ref Range Status   Specimen Description ABSCESS NECK RIGHT   Final   Special Requests PT ON UNISYN   Final   Gram Stain     Final   Value: NO WBC SEEN     NO SQUAMOUS EPITHELIAL CELLS SEEN  NO ORGANISMS SEEN     Performed at [facility]   Culture     Final   Value: NO ANAEROBES ISOLATED     Performed at [facility]   Report Status 01/03/2014 FINAL   Final  FUNGUS CULTURE W SMEAR     Status: None   Collection Time    12/29/13  9:37 AM      Result Value Ref Range Status   Specimen Description ABSCESS NECK RIGHT   Final   Special Requests PT ON UNISYN   Final   Fungal Smear     Final   Value: NO YEAST OR FUNGAL ELEMENTS SEEN     Performed at [facility]   Culture     Final   Value: CULTURE IN PROGRESS FOR FOUR WEEKS     Performed at [facility]   Report Status PENDING   Incomplete  AFB CULTURE WITH SMEAR     Status: None   Collection Time    12/29/13  9:37 AM      Result Value Ref Range Status   Specimen Description ABSCESS NECK RIGHT   Final   Special Requests PT ON UNISYN   Final   ACID FAST SMEAR     Final   Value: NO ACID FAST BACILLI SEEN     Performed at [facility]   Culture     Final   Value: CULTURE WILL BE EXAMINED FOR 6 WEEKS BEFORE ISSUING A FINAL REPORT     Performed at [facility]   Report Status PENDING   Incomplete  CULTURE, ROUTINE-ABSCESS     Status: None    Collection Time    12/29/13  9:37 AM      Result Value Ref Range Status   Specimen Description ABSCESS NECK RIGHT   Final   Special Requests PT ON UNISYN   Final   Gram Stain     Final   Value: ABUNDANT WBC PRESENT,BOTH PMN AND MONONUCLEAR     NO SQUAMOUS EPITHELIAL CELLS SEEN     ABUNDANT GRAM NEGATIVE RODS     [facility]formed at [facility]     Performed at [facility]   Culture     Final   Value: MODERATE KLEBSIELLA PNEUMONIAE     Performed at [facility]   Report Status 12/31/2013 FINAL   Final   Organism ID, Bacteria KLEBSIELLA PNEUMONIAE   Final  GRAM STAIN     Status: None   Collection Time    12/29/13  9:37 AM      Result Value Ref Range Status   Specimen Description ABSCESS NECK RIGHT   Final   Special Requests PT ON UNISYN   Final   Gram Stain     Final   Value: ABUNDANT WBC PRESENT,BOTH PMN AND MONONUCLEAR     ABUNDANT GRAM NEGATIVE RODS   Report Status 12/29/2013 FINAL   Final   Medications: I have reviewed the patient's current medications. Scheduled Meds: . cefTRIAXone (ROCEPHIN)  IV  2 g Intravenous Q24H  . heparin subcutaneous  5,000 Units Subcutaneous 3 times per day  . insulin aspart  0-15 Units Subcutaneous Q4H  . insulin aspart protamine- aspart  16 Units Subcutaneous BID WC  . oxymetazoline  1 spray Each Nare BID   Continuous Infusions: . sodium chloride 75 mL/hr at 01/03/14 0500   PRN Meds:.diphenhydrAMINE, HYDROcodone-acetaminophen, morphine injection, promethazine, promethazine  Assessment/Plan: [name] White is a 18  y.o. female w/ PMHx of uncontrolled DM type I (A1c 14%) and recent history of peritonsillar abscess s/p tonsillectomy and I&D, admitted for new right-sided lymph node abscess as seen by neck CT. S/p tracheostomy 2/22 for laryngeal edema.   Abscess of lymph node of neck: Patient s/p I&D of right lymph node abscess (12/29/13), culture positive for Klebsiella. S/p tracheostomy on 12/31/13, on trach collar, in no  acute distress. Repeat laryngoscopy as per ENT notes showed much improved edema and mobile cords. De-cannulated today, able to take po. -Started on Dys 2 diet. -Hycet 7.5-325 (10-20 ml) q4h prn + Morphine prn for breakthrough -Phenergan prn for nausea -Continue Rocephin today, discharge w/ Augmentin 875 bid for 12 more days.   DM Type I: HbA1c as of 12/21/13, 14%. CBG's controlled. On 70/30 16 units bid. NGT removed today. -Started on dysphagia 2 diet.  -Continue 70/30 16 units bid -SSI moderate  DV PPx: Heparin [place]  Dispo: Discharge tomorrow if medically stable.   The patient does have a current PCP (Jeanann Lewandowsky, MD) and does not need an [facility] hospital follow-up appointment after discharge.  The patient does not have transportation limitations that hinder transportation to clinic appointments.  .Services Needed at time of discharge: Y = Yes, Blank = No PT:   OT:   RN:   Equipment:   Other:     LOS: 7 days   Courtney Paris, MD 01/04/2014, 12:10 PM Pager: [phone]

## 2014-01-04 NOTE — Progress Notes (Signed)
Internal Medicine Attending  Date: 01/04/2014  Patient name: [name] SicksJuliana Tarazon Medical record number: [id number] Date of birth: 03/25/1996 Age: 18 y.o. Gender: female  I saw and evaluated the patient. I reviewed the resident's note by Dr. Yetta BarreJones and I agree with the resident's findings and plans as documented in his progress note.  Patient improved this AM.  Trach removed.  Deep seated Klebsiella infection of neck with significant edema clinically responding to antibiotics and time.  Will follow ID's recommendation on discharge antibiotic and duration with follow-up in ENT.  Anticipate discharge home in next 24 hours.

## 2014-01-05 LAB — GLUCOSE, CAPILLARY
GLUCOSE-CAPILLARY: 147 mg/dL — AB (ref 70–99)
Glucose-Capillary: 119 mg/dL — ABNORMAL HIGH (ref 70–99)
Glucose-Capillary: 157 mg/dL — ABNORMAL HIGH (ref 70–99)
Glucose-Capillary: 169 mg/dL — ABNORMAL HIGH (ref 70–99)

## 2014-01-05 MED ORDER — INSULIN NPH ISOPHANE & REGULAR (70-30) 100 UNIT/ML ~~LOC~~ SUSP: 18.0000 [IU] | Freq: Two times a day (BID) | SUBCUTANEOUS | Status: DC

## 2014-01-05 MED ORDER — OXYCODONE-ACETAMINOPHEN 5-325 MG PO TABS: 1.0000 | ORAL_TABLET | Freq: Four times a day (QID) | ORAL | Status: DC | PRN

## 2014-01-05 MED ORDER — AMOXICILLIN-POT CLAVULANATE 875-125 MG PO TABS
1.0000 | ORAL_TABLET | Freq: Two times a day (BID) | ORAL | Status: DC
  Administered 2014-01-05: 1 via ORAL
  Filled 2014-01-05: qty 1

## 2014-01-05 MED ORDER — AMOXICILLIN-POT CLAVULANATE 875-125 MG PO TABS: 1.0000 | ORAL_TABLET | Freq: Two times a day (BID) | ORAL | Status: DC

## 2014-01-05 NOTE — Discharge Summary (Signed)
Name: [name] SicksJuliana White MRN: [id number] DOB: 1996/01/02 18 y.o. PCP: Jeanann Lewandowskylugbemiga Jegede, MD  Date of Admission: 12/28/2013  3:27 AM Date of Discharge: 01/05/14 Attending Physician: Josem KaufmannKlima  Discharge Diagnosis: 1. Lymph Node Abscess 2. DM type I  Discharge Medications:   Medication List    STOP taking these medications       amoxicillin-clavulanate 500-125 MG per tablet  Commonly known as:  AUGMENTIN  Replaced by:  amoxicillin-clavulanate 875-125 MG per tablet      TAKE these medications       amoxicillin-clavulanate 875-125 MG per tablet  Commonly known as:  AUGMENTIN  Take 1 tablet by mouth every 12 (twelve) hours.     insulin NPH-regular Human (70-30) 100 UNIT/ML injection  Commonly known as:  NOVOLIN 70/30  Inject 18 Units into the skin 2 (two) times daily with a meal.     oxyCODONE-acetaminophen 5-325 MG per tablet  Commonly known as:  PERCOCET/ROXICET  Take 1-2 tablets by mouth every 6 (six) hours as needed for severe pain.        Disposition and follow-up:   Ms.[name] White [facility] Good [facility].  At the hospital follow up visit please address:  1.  Neck pain, swelling, dysphagia/odynophagia, fevers, chills, nausea, vomiting? Please address patient's compliance w/ Insulin. Discharged w/ 70/30 18 units bid. May need to adjust insulin based on blood sugars.   2.  Labs / imaging needed at time of follow-up: CBC, HbA1c 03/20/14  3.  Pending labs/ test needing follow-up: none  Follow-up Appointments: Follow-up Information   Follow up with Richarda OverlieABROL,NAYANA, MD On 01/12/2014. (9:00 AM)    Specialty:  Internal Medicine   Contact information:   [address] [place] [address] [phone]       Follow up with Flo ShanksWOLICKI, KAROL, MD On 01/16/2014. (10:30 AM)    Specialty:  Otolaryngology   Contact information:   [address] [place] [address] [phone]      Discharge  Instructions: Discharge Orders   Future Appointments Provider Department Dept Phone   01/12/2014 9:00 AM [facility] Covering Provider [facility] [phone]   03/26/2014 11:45 AM Jeanann Lewandowskylugbemiga Jegede, MD [facility] [phone]   Future Orders Complete By Expires   Call MD for:  difficulty breathing, headache or visual disturbances  As directed    Call MD for:  redness, tenderness, or signs of infection (pain, swelling, redness, odor or green/yellow discharge around incision site)  As directed    Call MD for:  severe uncontrolled pain  As directed    Call MD for:  temperature >100.4  As directed      Consultations: Treatment Team:  Flo ShanksKarol Wolicki, MD  Procedures Performed:  Ct Soft Tissue Neck W Contrast  12/31/2013   CLINICAL DATA:  Diabetes. Surgical drainage of left peritonsillar abscess 12/10/2013. Surgical drainage of abscessed lymph node in the right neck 12/28/2013. Persistent right neck swelling.  EXAM: CT NECK WITH CONTRAST  TECHNIQUE: Multidetector CT imaging of the neck was performed using the standard protocol following the bolus administration of intravenous contrast.  CONTRAST:  80mL OMNIPAQUE IOHEXOL 300 MG/ML  SOLN  COMPARISON:  CT neck 12/26/2013, 12/10/2013  FINDINGS: Surgical drain in the right neck. There is extensive soft tissue edema in the right neck involving the sternocleidomastoid muscle and parapharyngeal soft tissues. Soft tissue edema causes displacement of the airway to the left. There is marked edema of  the aryepiglottic fold on the right and hypopharynx on the right. There is also some edema involving the glottis on the right. No definite abscess is seen and this is most consistent with phlegmon. Posterior low-density lymph nodes are present in the right upper neck measuring approximately 17 x 28 mm, and 14 mm each.  Prevertebral soft tissue swelling measures 14 mm and has progressed from the prior study.  The  adenoid tissue is prominent. No peritonsillar abscess is identified. No enlarged lymph nodes in the left neck.  Parotid and submandibular glands and thyroid glands are normal bilaterally. Lung apices are clear. No acute bony change.  IMPRESSION: Extensive edema throughout the right neck which has progressed considerably since the prior study. Surgical drain is present in the right neck. There is edema involving the hypopharynx and larynx on the right displacing and narrowing the airway. There is progression of prevertebral soft tissue swelling. This is likely due to progressive infection however no abscess is identified.  I discussed the findings by telephone with Dr. Jenne Pane.   Electronically Signed   By: Marlan Palau M.D.   On: 12/31/2013 15:05   Ct Soft Tissue Neck W Contrast  12/26/2013   CLINICAL DATA:  Right-sided neck pain. Recent left peritonsillar abscess.  EXAM: CT NECK WITH CONTRAST  TECHNIQUE: Multidetector CT imaging of the neck was performed using the standard protocol following the bolus administration of intravenous contrast.  CONTRAST:  75mL OMNIPAQUE IOHEXOL 300 MG/ML  SOLN  COMPARISON:  CT scan of the neck dated 12/10/2013  FINDINGS: The patient has two abscessed lymph nodes deep to the right sternocleidomastoid muscle just posterior to the right internal carotid artery at approximately the level of the angle of mandible. There is a small amount of fluid adjacent to these lymph nodes. The largest of these abscesses is 14 mm on image number 19 of series 6.  The left peritonsillar abscess has resolved. There is still prevertebral soft tissue edema asymmetric to the left with prominence of the adenoidal tissues.  The parotid glands and submandibular glands are normal. No osseous abnormality.  There is a 12 mm nodule in the anterior aspect of the mid portion of the right lobe of the thyroid gland.  IMPRESSION: 1. Resolution of left peritonsillar abscess with residual prevertebral edema and  prominence of the adenoids. 2. New abscessed lymph nodes deep to the right sternocleidomastoid muscle at the level of the angle of the right side of the mandible posterior to the right internal carotid artery. 3. 12 mm nodule in the right lobe of the thyroid gland.   Electronically Signed   By: Geanie Cooley M.D.   On: 12/26/2013 20:37   Dg Abd Portable 1v  12/31/2013   CLINICAL DATA:  Feeding tube placement.  EXAM: PORTABLE ABDOMEN - 1 VIEW  COMPARISON:  None.  FINDINGS: A small bore feeding tube is identified with tip overlying the mid stomach.  The bowel gas pattern is unremarkable.  IMPRESSION: Small bore f  Admission HPI:  Ms. Trahan is an 18 year old woman with DM1 & recent history of left tonsillectomy (12/10/13, due to LEFT peritonsillar abscess with retropharyngeal effusion) who presents with worsening right neck swelling over the last two days. She describes the pain as sharp, 11/10, worse with swallowing, movement and touching. No significant improvement with PO pain medications. She was seen in the ED on 12/26/13 for similar complaints with plans for outpatient ENT follow up and PO augmentin + percocet. At that time,  the EDP did discuss with radiology, who noted they would not be able to drain the nodes. She reports compliance with abx, but had not yet followed up with ENT. She denies fever, but reports feeling hot with chills. No nausea or vomiting. No SOB.  To note, on 12/26/13, a CT scan of her neck was done and revealed resolusion of a left peritonsillar abscess with residual edema and prominent adenoids, new abscessed lymph nodes deep to the right SCM at the elvel of the angle of right mandible posterior to the right ICA and a 12mm nodule in the right lobe of the thyroid gland.   Hospital Course by problem list:   1. RIGHT-sided Lymph Node Abscess- Evidenced by CT scan on 12/26/13, new from prior CT scan on previous admission that revealed a left peritonsillar abscess that required left  tonsillectomy with I&D of the left parapharyngeal abscess. Per last d/c symmary, patient was to complete 14 days of antibiotic treatment (abscess cx grew klebsiella sensitive to cephalosporins & amp/sulbactum, discharged with augmentin). Patient claims she was compliant w/ po antibiotics at home, however compliance is a question with Ms. Shackleford given her uncontrolled DM. Patient noted pain and swelling on the right side of her neck, no leukocytosis. Blood sugars noted to be >200 on admission.  ENT saw patient in ED, started on Unasyn IV, w/ no improvement the next day, in fact patient noted worsened pain, swelling, and odynophagia. On 12/29/13, patient was taken for I&D, which drained abundant grey colored purulent discharge, also later found to be positive for Klebsiella. Patient was continued on Unasyn w/ continued pain and laryngeal edema. Pain controlled by Hycet 7.5-325 (10-20 ml) q4h prn + Morphine prn for breakthrough pain. On 12/31/13, patient w/ difficulty swallowing and noted by nursing staff to have saliva coming from her nose. Mild difficulty breathing as well. CT neck was repeated, showed edema involving the hypopharynx and larynx on  the right displacing and narrowing the airway. Laryngoscopy showed significant laryngeal edema and tracheostomy was performed in order to protect the patient's airway. No complications w/ this procedure. NGT also placed, tube feeds started. Unasyn changed to Cipro temporarily, however, ID was consulted by ENT, and ABx were changed to Rocephin IV. Laryngoscopy repeated on 01/03/14, which showed significantly reduced edema. On 01/04/14, trach was de-cannulated, w/ no issues. Patient satting well, able to swallow. NGT removed, started back on soft diet, which was then advanced as tolerated. Patient discharged on Augmentin 875 bid for 12 more days and [facility] as well as Dr. Lazarus Salines.  2. DM type I- Patient is known to be non-complaint w/  her home insulin; take 70/30 15 units bid at home. Most recent HbA1c of 14.0% on 12/21/13. On admission, given decreased po intake, patient was started on 70/30 7 units bid + ISS moderate. CBG's were closely monitored as patient was at hight risk for developing DKA as she had during her previous hospitalization/lymph node infection. On 12/29/13, patient was increased to 10 units bid, then 12 units on 12/30/13, and 13 units on 12/31/13. On the evening of 2/22, patient received tracheostomy + NGT w/ tube feeds. Increased 70/30 insulin to 16 units bid with good control of blood sugar (90-180). When NGT was removed and patient tolerated po intake, continued 16 units bid w/ no issues. During admission, patient received some Novolog coverage in addition to 70/30. Given h/o non-compliance, continued ONLY 70/30 at 18 units bid. [facility]-2 weeks for further management of insulin regimen.   Discharge Vitals:   BP 108/64  Pulse 84  Temp(Src) 98.2 F (36.8 C) (Oral)  Resp 18  Ht 5\' 5"  (1.651 m)  Wt 135 lb 5.8 oz (61.4 kg)  BMI 22.53 kg/m2  SpO2 100%  LMP 11/23/2013  Discharge Labs:  No results found for this or any previous visit (from the past 24 hour(s)).  Signed: Courtney Paris, MD 01/06/2014, 3:01 PM   Time Spent on Discharge: 35 minutes Services Ordered on Discharge: none Equipment Ordered on Discharge: none

## 2014-01-05 NOTE — Progress Notes (Signed)
Subjective: Patient seen at bedside this AM. Significantly improved, tracheostomy removed yesterday. Tolerating po intake. Complains of very mild pain at trach site, but otherwise no fever, chills, nausea, vomiting, or difficulty breathing. Phonated well.  Objective: Vital signs in last 24 hours: Filed Vitals:   01/04/14 1329 01/04/14 1652 01/04/14 2148 01/05/14 0550  BP:  128/80 94/61 104/60  Pulse: 74 76 81 61  Temp:  97.4 F (36.3 C) 98.1 F (36.7 C) 98 F (36.7 C)  TempSrc:  Oral Oral Oral  Resp: 14 18 18 16   Height:      Weight:      SpO2:  100% 99% 100%   Weight change:   Intake/Output Summary (Last 24 hours) at 01/05/14 0746 Last data filed at 01/04/14 1534  Gross per 24 hour  Intake   1500 ml  Output    400 ml  Net   1100 ml   Physical Exam: General: Alert, cooperative, NAD.  HEENT: PERRL, EOMI. Neck bandaged, appears clean, dry, intact.  Lungs: Normal work of respiration. No wheezes, rales, or rhonchi. Heart: RRR. No murmurs, gallops, or rubs. Abdomen: Soft, non-tender, non-distended. BS + Extremities: No cyanosis, clubbing, or edema Neurologic: Alert & oriented X3, no focal sensory or motor abnormalities  Lab Results: Basic Metabolic Panel:  Recent Labs Lab 01/02/14 0225 01/03/14 0331  NA 144 141  K 3.5* 3.9  CL 106 102  CO2 27 27  GLUCOSE 162* 146*  BUN 11 5*  CREATININE 0.40* 0.32*  CALCIUM 8.7 8.9  MG 2.0  --    CBC:  Recent Labs Lab 12/31/13 1035 01/01/14 0300 01/02/14 0225  WBC 8.4 9.3 7.5  NEUTROABS 6.1 7.7  --   HGB 10.1* 10.1* 9.0*  HCT 31.3* 30.7* 28.0*  MCV 83.9 82.7 83.8  PLT 254 279 267   CBG:  Recent Labs Lab 01/04/14 0853 01/04/14 1234 01/04/14 1708 01/04/14 2000 01/05/14 0031 01/05/14 0552  GLUCAP 184* 180* 94 325* 119* 169*   Micro Results: Recent Results (from the past 240 hour(s))  SURGICAL PCR SCREEN     Status: None   Collection Time    12/29/13  7:36 AM      Result Value Ref Range Status   MRSA,  PCR NEGATIVE  NEGATIVE Final   Staphylococcus aureus NEGATIVE  NEGATIVE Final   Comment:            The Xpert SA Assay (FDA     approved for NASAL specimens     in patients over 19 years of age),     is one component of     a comprehensive surveillance     program.  Test performance has     been validated by [facility] for patients greater     than or equal to 19 year old.     It is not intended     to diagnose infection nor to     guide or monitor treatment.  ANAEROBIC CULTURE     Status: None   Collection Time    12/29/13  9:37 AM      Result Value Ref Range Status   Specimen Description ABSCESS NECK RIGHT   Final   Special Requests PT ON UNISYN   Final   Gram Stain     Final   Value: NO WBC SEEN     NO SQUAMOUS EPITHELIAL CELLS SEEN     NO ORGANISMS SEEN     Performed at  [facility]     Final   Value: NO ANAEROBES ISOLATED     Performed at [facility]   Report Status 01/03/2014 FINAL   Final  FUNGUS CULTURE W SMEAR     Status: None   Collection Time    12/29/13  9:37 AM      Result Value Ref Range Status   Specimen Description ABSCESS NECK RIGHT   Final   Special Requests PT ON UNISYN   Final   Fungal Smear     Final   Value: NO YEAST OR FUNGAL ELEMENTS SEEN     Performed at [facility]   Culture     Final   Value: CULTURE IN PROGRESS FOR FOUR WEEKS     Performed at [facility]   Report Status PENDING   Incomplete  AFB CULTURE WITH SMEAR     Status: None   Collection Time    12/29/13  9:37 AM      Result Value Ref Range Status   Specimen Description ABSCESS NECK RIGHT   Final   Special Requests PT ON UNISYN   Final   ACID FAST SMEAR     Final   Value: NO ACID FAST BACILLI SEEN     Performed at [facility]   Culture     Final   Value: CULTURE WILL BE EXAMINED FOR 6 WEEKS BEFORE ISSUING A FINAL REPORT     Performed at [facility]   Report Status PENDING   Incomplete  CULTURE, ROUTINE-ABSCESS      Status: None   Collection Time    12/29/13  9:37 AM      Result Value Ref Range Status   Specimen Description ABSCESS NECK RIGHT   Final   Special Requests PT ON UNISYN   Final   Gram Stain     Final   Value: ABUNDANT WBC PRESENT,BOTH PMN AND MONONUCLEAR     NO SQUAMOUS EPITHELIAL CELLS SEEN     ABUNDANT GRAM NEGATIVE RODS     Performed at [facility]     Performed at [facility]   Culture     Final   Value: MODERATE KLEBSIELLA PNEUMONIAE     Performed at [facility]   Report Status 12/31/2013 FINAL   Final   Organism ID, Bacteria KLEBSIELLA PNEUMONIAE   Final  GRAM STAIN     Status: None   Collection Time    12/29/13  9:37 AM      Result Value Ref Range Status   Specimen Description ABSCESS NECK RIGHT   Final   Special Requests PT ON UNISYN   Final   Gram Stain     Final   Value: ABUNDANT WBC PRESENT,BOTH PMN AND MONONUCLEAR     ABUNDANT GRAM NEGATIVE RODS   Report Status 12/29/2013 FINAL   Final   Medications: I have reviewed the patient's current medications. Scheduled Meds: . cefTRIAXone (ROCEPHIN)  IV  2 g Intravenous Q24H  . heparin subcutaneous  5,000 Units Subcutaneous 3 times per day  . insulin aspart  0-15 Units Subcutaneous Q4H  . insulin aspart protamine- aspart  16 Units Subcutaneous BID WC  . oxymetazoline  1 spray Each Nare BID   Continuous Infusions:   PRN Meds:.diphenhydrAMINE, HYDROcodone-acetaminophen, morphine injection, promethazine, promethazine  Assessment/Plan: [name] is a 18 y.o. female w/ PMHx of uncontrolled DM type I (A1c 14%) and recent history of peritonsillar abscess  s/p tonsillectomy and I&D, admitted for new right-sided lymph node abscess as seen by neck CT.   Abscess of lymph node of neck: Patient s/p I&D of right lymph node abscess (12/29/13), culture positive for Klebsiella. S/p tracheostomy on 12/31/13, removed yesterday (01/04/14). Significantly improved today, no difficulty phonating, able to  take po without difficulty.  -Advance diet as tolerated -Hycet 7.5-325 (10-20 ml) q4h prn -Phenergan prn for nausea -Augmentin 875 bid for 12 more days.   DM Type I: HbA1c as of 12/21/13, 14%. CBG's controlled. On 70/30 16 units bid. -Continue 70/30 16 units bid -SSI moderate  DV PPx: Heparin Colman  Dispo: Discharge today.  The patient does have a current PCP (Jeanann Lewandowsky, MD) and does not need an [facility] hospital follow-up appointment after discharge.  The patient does not have transportation limitations that hinder transportation to clinic appointments.  .Services Needed at time of discharge: Y = Yes, Blank = No PT:   OT:   RN:   Equipment:   Other:     LOS: 8 days   Courtney Paris, MD 01/05/2014, 7:46 AM Pager: [phone]

## 2014-01-05 NOTE — Discharge Planning (Signed)
Copy of home instructions and rx to pt. Who verbalizes understanding. d'cd amb to private car home with all personal belongings, acop. By mom.

## 2014-01-05 NOTE — Progress Notes (Signed)
01/05/2014 12:13 PM  [name], Aileen [id number]  Post-Op Day 5/7    Temp:  [97.4 F (36.3 C)-98.1 F (36.7 C)] 98 F (36.7 C) (02/27 0550) Pulse Rate:  [61-107] 107 (02/27 0924) Resp:  [14-18] 18 (02/27 0924) BP: (94-128)/(60-80) 104/60 mmHg (02/27 0550) SpO2:  [97 %-100 %] 97 % (02/27 0924) FiO2 (%):  [28 %] 28 % (02/26 1329),     Intake/Output Summary (Last 24 hours) at 01/05/14 1213 Last data filed at 01/04/14 1534  Gross per 24 hour  Intake   1260 ml  Output      0 ml  Net   1260 ml    Results for orders placed during the hospital encounter of 12/28/13 (from the past 24 hour(s))  GLUCOSE, CAPILLARY     Status: Abnormal   Collection Time    01/04/14 12:34 PM      Result Value Ref Range   Glucose-Capillary 180 (*) 70 - 99 mg/dL   Comment 1 Documented in Chart     Comment 2 Notify RN    GLUCOSE, CAPILLARY     Status: None   Collection Time    01/04/14  5:08 PM      Result Value Ref Range   Glucose-Capillary 94  70 - 99 mg/dL   Comment 1 Documented in Chart     Comment 2 Notify RN    GLUCOSE, CAPILLARY     Status: Abnormal   Collection Time    01/04/14  8:00 PM      Result Value Ref Range   Glucose-Capillary 325 (*) 70 - 99 mg/dL   Comment 1 Notify RN    GLUCOSE, CAPILLARY     Status: Abnormal   Collection Time    01/05/14 12:31 AM      Result Value Ref Range   Glucose-Capillary 119 (*) 70 - 99 mg/dL   Comment 1 Notify RN    GLUCOSE, CAPILLARY     Status: Abnormal   Collection Time    01/05/14  5:52 AM      Result Value Ref Range   Glucose-Capillary 169 (*) 70 - 99 mg/dL   Comment 1 Notify RN    GLUCOSE, CAPILLARY     Status: Abnormal   Collection Time    01/05/14  8:21 AM      Result Value Ref Range   Glucose-Capillary 147 (*) 70 - 99 mg/dL  GLUCOSE, CAPILLARY     Status: Abnormal   Collection Time    01/05/14 11:48 AM      Result Value Ref Range   Glucose-Capillary 157 (*) 70 - 99 mg/dL    SUBJECTIVE:  Some pain at trach site.  Min pain  RIGHT neck.  Breathing, swallowing OK  OBJECTIVE:  Trach site closing quickly.  Mild drainage RIGHT neck wound.  Dressings replaced.  One final suture removed.  IMPRESSION:  Satisfactory check.    PLAN:  OK for discharge.  Pain Rx, Antibiotic Rx.  Recheck my office 1 week.  OK to shower, then change dressings at least once daily.    Flo ShanksWOLICKI, Shamari Lofquist

## 2014-01-05 NOTE — Progress Notes (Signed)
Speech Language Pathology Treatment: Dysphagia  Patient Details Name: [name] White MRN: [id number] DOB: 08/17/1996 Today's Date: 01/05/2014 Time: 0037-0488 SLP Time Calculation (min): 10 min  Assessment / Plan / Recommendation Clinical Impression  Pt now de-cannulated, has PMSV in container in room.  Advised pt to clean and keep valve.  Pt using finger occlusion over wound for phonation.  Pt continues with some odynophagia but states it has improved (pt points to left central side of neck to indicate pain).  Observed pt consuming water - timely swallow, no indications of discomfort or aspiration.   Pt coughing on secretions - mother encouraging pt to expectorate them instead of swallowing- SLP agrees.   Recommend advance diet to regular to allow pt to order items she can manage given her awareness of pain, issues. SLP to sign off as no further needs identified.       HPI HPI: HPI: 18 yo bf, DM, had LEFT parapharyngeal abscess I&D and LEFT tonsillectomy 1 FEB.  Abscess grew Klebsiella.  DM out of control at that time.  Feeling well until 2 days prior to admit with relatively rapid onset RIGHT neck swelling and pain.  Seen in ER with 2 RIGHT Level II abscessed lymph nodes, largest 56m in longest axis.  Underwent incision and drainage of this right sided lymph node abscess 2/20.  On 2/24, pt underwent bedside fiberoptic laryngoscopy showing marked edema of right supraglottic larynx obstructing view of glottis and half of larynx; trached same day.   Pt has been decannulated and is consuming mostly soft foods due to discomfort with po intake.  Plans are for pt to dc within the next few days per md note.     Pertinent Vitals Afebrile, decreased  SLP Plan  All goals met    Recommendations Diet recommendations: Regular ;Thin liquid Liquids provided via: Cup;Straw Medication Administration:  (as tolerated, allow pt to indicate medication administration) Supervision: Patient able to self  feed Compensations: Slow rate;Small sips/bites Postural Changes and/or Swallow Maneuvers: Seated upright 90 degrees              Follow up Recommendations: None Plan: All goals met    [facility] MC[facility]innatiCThe Addiction Institute [facility] YorkSLP [phone]

## 2014-01-05 NOTE — Discharge Instructions (Signed)
1. Please see appointments as follows:  Richarda OverlieAbrol, Nayana  On 01/12/2014 9:00 AM  [address] [place] [address] [phone]  Flo ShanksWolicki, Karol  On 01/16/2014 10:30 AM  [address] [place] [address] [phone]  2. Please take all medications as prescribed. Take Augmentin for 12 more days (end date 01/17/14).   3. If you have worsening of your symptoms or new symptoms arise, please call the clinic [phone]), or go to the ER immediately if symptoms are severe.   Peritonsillar Abscess A peritonsillar abscess is a collection of pus located in the back of the throat behind the tonsils. It usually occurs when a streptococcal infection of the throat or tonsils spreads into the space around the tonsils. They are almost always caused by the streptococcal germ (bacteria). The treatment of a peritonsillar abscess is most often drainage accomplished by putting a needle into the abscess or cutting (incising) and draining the abscess. This is most often followed with a course of antibiotics. HOME CARE INSTRUCTIONS  If your abscess was drained by your caregiver today, rinse your throat (gargle) with warm salt water four times per day or as needed for comfort. Do not swallow this mixture. Mix 1 teaspoon of salt in 8 ounces of warm water for gargling.  Rest in bed as needed. Resume activities as able.  Apply cold to your neck for pain relief. Fill a plastic bag with ice and wrap it in a towel. Hold the ice on your neck for 20 minutes 4 times per day.  Eat a soft or liquid diet as tolerated while your throat remains sore. Popsicles and ice cream may be good early choices. Drinking plenty of cold fluids will probably be soothing and help take swelling down in between the warm gargles.  Only take over-the-counter or prescription medicines for pain, discomfort, or fever as directed by your caregiver. Do not use aspirin unless directed by your physician. Aspirin slows down the clotting  process. It can also cause bleeding from the drainage area if this was needled or incised today.  If antibiotics were prescribed, take them as directed for the full course of the prescription. Even if you feel you are well, you need to take them. SEEK MEDICAL CARE IF:   You have increased pain, swelling, redness, or drainage in your throat.  You develop signs of infection such as dizziness, headache, lethargy, or generalized feelings of illness.  You have difficulty breathing, swallowing or eating.  You show signs of becoming dehydrated (lightheadedness when standing, decreased urine output, a fast heart rate, or dry mouth and mucous membranes). SEEK IMMEDIATE MEDICAL CARE IF:   You have a fever.  You are coughing up or vomiting blood.  You develop more severe throat pain uncontrolled with medicines or you start to drool.  You develop difficulty breathing, talking, or find it easier to breathe while leaning forward. Document Released: 10/26/2005 Document Revised: 01/18/2012 Document Reviewed: 06/08/2008 [facility] Patient Information 2014 [place], [place].

## 2014-01-05 NOTE — Care Management Note (Signed)
    Page 1 of 1   01/05/2014     2:11:51 PM   CARE MANAGEMENT NOTE 01/05/2014  Patient:  [name] White,[name] White   Account Number:  [id number]  Date Initiated:  01/02/2014  Documentation initiated by:  Donn PieriniWEBSTER,KRISTI  Subjective/Objective Assessment:   Pt admitted with abscess of neck lymph node- s/p I&D     Action/Plan:   PTA pt was living at home- recently moved here from [place]   Anticipated DC Date:  01/05/2014   Anticipated DC Plan:  HOME/SELF CARE      DC Planning Services  CM consult      Choice offered to / List presented to:             Status of service:  In process, will continue to follow Medicare Important Message given?   (If response is "NO", the following Medicare IM given date fields will be blank) Date Medicare IM given:   Date Additional Medicare IM given:    Discharge Disposition:    Per UR Regulation:  Reviewed for med. necessity/level of care/duration of stay  If discussed at Long Length of Stay Meetings, dates discussed:    Comments:  01-05-14 Consult for : Patient w/out source of income, inquiring about medical bills and payment.  Spoke to patient . Called Financial Counselor PlymouthAna Moreno [phone] , instructed to tell patient to call [phone] and ask for customer Service to set up payment plan. Ronny FlurryHeather Haeden Hudock RN BSN [phone]    01/02/14- 1120- Donn PieriniKristi Webster RN, BSN [phone] Pt had to be trached on 12/31/13- mother at bedside- [facility]- NCM to follow for d/c needs

## 2014-01-05 NOTE — Progress Notes (Signed)
Internal Medicine Attending  Date: 01/05/2014  Patient name: Philippa SicksJuliana Quinonez Medical record number: [id number] Date of birth: October 05, 1996 Age: 18 y.o. Gender: female  I saw and evaluated the patient. I reviewed the resident's note by Dr. Yetta BarreJones and I agree with the resident's findings and plans as documented in his progress note.  [name] had the trach removed yesterday and is doing well.  She is able to eat and drink w/o problems and is phonating well.  She will be discharged today on an additional 12 days of Augmentin with follow-up in ENT in 1 week.

## 2014-01-12 ENCOUNTER — Ambulatory Visit: Payer: Medicaid Other | Attending: Internal Medicine | Admitting: Internal Medicine

## 2014-01-12 ENCOUNTER — Encounter: Payer: Self-pay | Admitting: Internal Medicine

## 2014-01-12 VITALS — BP 130/79 | HR 79 | Temp 98.1°F | Resp 14 | Ht 66.0 in | Wt 138.8 lb

## 2014-01-12 DIAGNOSIS — Z9119 Patient's noncompliance with other medical treatment and regimen: Secondary | ICD-10-CM | POA: Insufficient documentation

## 2014-01-12 DIAGNOSIS — Z794 Long term (current) use of insulin: Secondary | ICD-10-CM | POA: Insufficient documentation

## 2014-01-12 DIAGNOSIS — IMO0002 Reserved for concepts with insufficient information to code with codable children: Secondary | ICD-10-CM | POA: Insufficient documentation

## 2014-01-12 DIAGNOSIS — Z91199 Patient's noncompliance with other medical treatment and regimen due to unspecified reason: Secondary | ICD-10-CM | POA: Insufficient documentation

## 2014-01-12 DIAGNOSIS — E109 Type 1 diabetes mellitus without complications: Secondary | ICD-10-CM

## 2014-01-12 DIAGNOSIS — E1065 Type 1 diabetes mellitus with hyperglycemia: Secondary | ICD-10-CM | POA: Insufficient documentation

## 2014-01-12 DIAGNOSIS — D649 Anemia, unspecified: Secondary | ICD-10-CM | POA: Insufficient documentation

## 2014-01-12 LAB — CBC WITH DIFFERENTIAL/PLATELET
Basophils Absolute: 0 10*3/uL (ref 0.0–0.1)
Basophils Relative: 0 % (ref 0–1)
EOS PCT: 1 % (ref 0–5)
Eosinophils Absolute: 0.1 10*3/uL (ref 0.0–0.7)
HCT: 36.1 % (ref 36.0–46.0)
Hemoglobin: 11.8 g/dL — ABNORMAL LOW (ref 12.0–15.0)
LYMPHS ABS: 1.5 10*3/uL (ref 0.7–4.0)
LYMPHS PCT: 24 % (ref 12–46)
MCH: 27 pg (ref 26.0–34.0)
MCHC: 32.7 g/dL (ref 30.0–36.0)
MCV: 82.6 fL (ref 78.0–100.0)
Monocytes Absolute: 0.5 10*3/uL (ref 0.1–1.0)
Monocytes Relative: 8 % (ref 3–12)
NEUTROS PCT: 67 % (ref 43–77)
Neutro Abs: 4.2 10*3/uL (ref 1.7–7.7)
Platelets: 424 10*3/uL — ABNORMAL HIGH (ref 150–400)
RBC: 4.37 MIL/uL (ref 3.87–5.11)
RDW: 14.5 % (ref 11.5–15.5)
WBC: 6.2 10*3/uL (ref 4.0–10.5)

## 2014-01-12 LAB — COMPLETE METABOLIC PANEL WITH GFR
ALT: 11 U/L (ref 0–35)
AST: 15 U/L (ref 0–37)
Albumin: 3.8 g/dL (ref 3.5–5.2)
Alkaline Phosphatase: 119 U/L — ABNORMAL HIGH (ref 39–117)
BUN: 10 mg/dL (ref 6–23)
CO2: 29 mEq/L (ref 19–32)
Calcium: 9.5 mg/dL (ref 8.4–10.5)
Chloride: 97 mEq/L (ref 96–112)
Creat: 0.46 mg/dL — ABNORMAL LOW (ref 0.50–1.10)
GFR, Est African American: >89 mL/min
GFR, Est Non African American: >89 mL/min
Glucose, Bld: 210 mg/dL (ref 70–99)
Potassium: 4.5 mEq/L (ref 3.5–5.3)
Sodium: 135 mEq/L (ref 135–145)
Total Bilirubin: 0.2 mg/dL (ref 0.2–1.1)
Total Protein: 7.1 g/dL (ref 6.0–8.3)

## 2014-01-12 LAB — LIPID PANEL
Cholesterol: 168 mg/dL (ref 0–169)
HDL: 58 mg/dL (ref >34)
LDL Cholesterol: 93 mg/dL (ref 0–109)
Total CHOL/HDL Ratio: 2.9 Ratio
Triglycerides: 84 mg/dL (ref <150)
VLDL: 17 mg/dL (ref 0–40)

## 2014-01-12 LAB — GLUCOSE, POCT (MANUAL RESULT ENTRY): POC Glucose: 231 mg/dl — AB (ref 70–99)

## 2014-01-12 MED ORDER — INSULIN NPH ISOPHANE & REGULAR (70-30) 100 UNIT/ML ~~LOC~~ SUSP: 25.0000 [IU] | Freq: Two times a day (BID) | SUBCUTANEOUS | Status: DC

## 2014-01-12 MED ORDER — GLUCOSE BLOOD VI STRP: ORAL_STRIP | Status: AC

## 2014-01-12 NOTE — Progress Notes (Signed)
Patient ID: [name] White, female   DOB: [birth year], 18 y.o.   MRN: [id number]   CC:  HPI: 18 year old female here to establish care.She underwent left tonsillectomy 12/10/13, after she developed a left peritonsillar abscess. She has a history of diabetes and was recently admitted for right neck swelling, difficulty swallowing, pain on 2/19. On 12/26/13, a CT scan of her neck was done and revealed resolusion of a left peritonsillar abscess with residual edema and prominent adenoids, new abscessed lymph nodes deep to the right SCM at the elvel of the angle of right mandible posterior to the right ICA and a 12mm nodule in the right lobe of the thyroid gland. She developed this right-sided abscess after completing 14 days of antibiotic.  She was admitted to/19 for the right sided abscess. Treated with IV Unasyn.On 12/29/13, patient was taken for I&D, which drained abundant grey colored purulent discharge, also later found to be positive for Klebsiella. Patient was continued on Unasyn w/ continued pain and laryngeal edema.    On 12/31/13, patient w/ difficulty swallowing .Laryngoscopy showed significant laryngeal edema and tracheostomy was performed in order to protect the patient's airway.Laryngoscopy repeated on 01/03/14, which showed significantly reduced edema. On 01/04/14, trach was de-cannulated, w/ no issues  Patient has been a diabetic since age 14, A1c was 14.0. She was started on insulin 70/30, 18 units twice a day. Her CBG at home has been elevated in the 250-300 range. Today she is 231 in the clinic.  Social history Nonsmoker nonalcoholic Family history positive for diabetes in the mother     No Known Allergies Past Medical History  Diagnosis Date  . Diabetes mellitus without complication     had since she was 16   Current Outpatient Prescriptions on File Prior to Visit  Medication Sig Dispense Refill  . amoxicillin-clavulanate (AUGMENTIN) 875-125 MG per tablet Take 1 tablet by mouth  every 12 (twelve) hours.  25 tablet  0  . oxyCODONE-acetaminophen (PERCOCET/ROXICET) 5-325 MG per tablet Take 1-2 tablets by mouth every 6 (six) hours as needed for severe pain.  30 tablet  0   No current facility-administered medications on file prior to visit.   Family History  Problem Relation Age of Onset  . Diabetes type I Mother    History   Social History  . Marital Status: Single    Spouse Name: N/A    Number of Children: 1  . Years of Education: 12th   Occupational History  . Not on file.   Social History Main Topics  . Smoking status: Never Smoker   . Smokeless tobacco: Never Used  . Alcohol Use: No  . Drug Use: No  . Sexual Activity: Not on file   Other Topics Concern  . Not on file   Social History Narrative   Lives with boyfriend, no self source of income; originally from [place]. She immigrated to [place], [place], and moved to [place] at the end of 2014.    Review of Systems  Constitutional: Negative for fever, chills, diaphoresis, activity change, appetite change and fatigue.  HENT: Negative for ear pain, nosebleeds, congestion, facial swelling, rhinorrhea, neck pain, neck stiffness and ear discharge.   Eyes: Negative for pain, discharge, redness, itching and visual disturbance.  Respiratory: Negative for cough, choking, chest tightness, shortness of breath, wheezing and stridor.   Cardiovascular: Negative for chest pain, palpitations and leg swelling.  Gastrointestinal: Negative for abdominal distention.  Genitourinary: Negative for dysuria, urgency, frequency, hematuria, flank pain, decreased urine  volume, difficulty urinating and dyspareunia.  Musculoskeletal: Negative for back pain, joint swelling, arthralgias and gait problem.  Neurological: Negative for dizziness, tremors, seizures, syncope, facial asymmetry, speech difficulty, weakness, light-headedness, numbness and headaches.  Hematological: Negative for adenopathy. Does not bruise/bleed  easily.  Psychiatric/Behavioral: Negative for hallucinations, behavioral problems, confusion, dysphoric mood, decreased concentration and agitation.    Objective:   Filed Vitals:   01/12/14 0925  BP: 130/79  Pulse: 79  Temp: 98.1 F (36.7 C)  Resp: 14    Physical Exam  Constitutional: Appears well-developed and well-nourished. No distress.  HENT: Normocephalic. External right and left ear normal. Oropharynx is clear and moist.  Eyes: Conjunctivae and EOM are normal. PERRLA, no scleral icterus.  Neck: Normal ROM. Neck supple. No JVD. No tracheal deviation. No thyromegaly.  CVS: RRR, S1/S2 +, no murmurs, no gallops, no carotid bruit.  Pulmonary: Effort and breath sounds normal, no stridor, rhonchi, wheezes, rales.  Abdominal: Soft. BS +,  no distension, tenderness, rebound or guarding.  Musculoskeletal: Normal range of motion. No edema and no tenderness.  Lymphadenopathy: No lymphadenopathy noted, cervical, inguinal. Neuro: Alert. Normal reflexes, muscle tone coordination. No cranial nerve deficit. Skin: Skin is warm and dry. No rash noted. Not diaphoretic. No erythema. No pallor.  Psychiatric: Normal mood and affect. Behavior, judgment, thought content normal.   Lab Results  Component Value Date   WBC 7.5 01/02/2014   HGB 9.0* 01/02/2014   HCT 28.0* 01/02/2014   MCV 83.8 01/02/2014   PLT 267 01/02/2014   Lab Results  Component Value Date   CREATININE 0.32* 01/03/2014   BUN 5* 01/03/2014   NA 141 01/03/2014   K 3.9 01/03/2014   CL 102 01/03/2014   CO2 27 01/03/2014    Lab Results  Component Value Date   HGBA1C 14.0 12/21/2013   Lipid Panel  No results found for this basename: chol, trig, hdl, cholhdl, vldl, ldlcalc       Assessment and plan:   Patient Active Problem List   Diagnosis Date Noted  . Tracheostomy, acute management 01/01/2014  . Dysphagia, oropharyngeal 12/31/2013  . Abscess of lymph node of neck 12/28/2013  . Normocytic anemia 12/12/2013  .  Parapharyngeal abscess 12/10/2013  . DKA (diabetic ketoacidoses) 12/07/2013  . Diabetes mellitus type 1 12/07/2013  . Medically noncompliant 12/07/2013   Diabetes mellitus type 1 Uncontrolled Increase insulin 70/30-25 units twice a day A1c in May 2015   Normocytic anemia Repeat CBC today   Neck abscess Resolving. Patient still taking antibiotics in fact has an appointment with ENT on 3/10  Establish care Will refer patient for a Pap smear Refusing vaccinations  Followup in 3 months        The patient was given clear instructions to go to ER or return to medical center if symptoms don't improve, worsen or new problems develop. The patient verbalized understanding. The patient was told to call to get any lab results if not heard anything in the next week.

## 2014-01-12 NOTE — Progress Notes (Signed)
Patient is here for a hospital follow up for an abscess. Patient is an uncontrolled diabetic. Patient has no complaints today. Patient checked glucose this morning with a reading of 266.

## 2014-01-13 LAB — TSH: TSH: 0.316 u[IU]/mL — ABNORMAL LOW (ref 0.350–4.500)

## 2014-01-13 LAB — VITAMIN D 25 HYDROXY (VIT D DEFICIENCY, FRACTURES): VIT D 25 HYDROXY: 40 ng/mL (ref 30–89)

## 2014-01-13 NOTE — Addendum Note (Signed)
Addended by: Susie CassetteABROL MD, Germain OsgoodNAYANA on: 01/13/2014 12:18 PM   Modules accepted: Orders

## 2014-01-15 ENCOUNTER — Telehealth: Payer: Self-pay | Admitting: *Deleted

## 2014-01-15 NOTE — Telephone Encounter (Signed)
Left a voicemail with patient's mate to return our call. Patient needs to schedule a lab visit for thyroid panel asap.

## 2014-01-15 NOTE — Telephone Encounter (Signed)
Message copied by Magnum Lunde, [place] on Mon Jan 15, 2014  2:23 PM ------      Message from: Susie CassetteABROL MD, [facility]      Created: Sat Jan 13, 2014 12:19 PM       Notify patient that labs are stable except TSH is low. This needs to be investigated further with a complete thyroid function panel. Future labs ordered for next week ------

## 2014-01-16 ENCOUNTER — Telehealth: Payer: Self-pay | Admitting: *Deleted

## 2014-01-16 NOTE — Telephone Encounter (Signed)
Contacted patient again to notify her of her lab results. Left patient a voicemail for her to return our call as soon as possible.

## 2014-01-16 NOTE — Telephone Encounter (Signed)
Message copied by Raidon Swanner, [place] on Tue Jan 16, 2014 11:31 AM ------      Message from: Susie CassetteABROL MD, Germain OsgoodNAYANA      Created: Sat Jan 13, 2014 12:19 PM       Notify patient that labs are stable except TSH is low. This needs to be investigated further with a complete thyroid function panel. Future labs ordered for next week ------

## 2014-01-16 NOTE — Telephone Encounter (Signed)
Contacted patient to notify her of her lab results. Left a voicemail for patient to return our call.

## 2014-01-16 NOTE — Telephone Encounter (Signed)
Message copied by Lachae Hohler, [place] on Tue Jan 16, 2014  9:34 AM ------      Message from: Susie CassetteABROL MD, [facility]      Created: Sat Jan 13, 2014 12:19 PM       Notify patient that labs are stable except TSH is low. This needs to be investigated further with a complete thyroid function panel. Future labs ordered for next week ------

## 2014-01-18 ENCOUNTER — Encounter (HOSPITAL_COMMUNITY): Payer: Self-pay | Admitting: Otolaryngology

## 2014-01-23 LAB — AFB CULTURE WITH SMEAR (NOT AT ARMC): Acid Fast Smear: NONE SEEN

## 2014-01-28 LAB — FUNGUS CULTURE W SMEAR: Fungal Smear: NONE SEEN

## 2014-02-10 LAB — AFB CULTURE WITH SMEAR (NOT AT ARMC): Acid Fast Smear: NONE SEEN

## 2014-02-22 ENCOUNTER — Telehealth: Payer: Self-pay | Admitting: *Deleted

## 2014-03-01 NOTE — Telephone Encounter (Signed)
Error

## 2014-03-26 ENCOUNTER — Ambulatory Visit: Payer: Medicaid Other | Admitting: Internal Medicine

## 2014-03-27 ENCOUNTER — Ambulatory Visit (INDEPENDENT_AMBULATORY_CARE_PROVIDER_SITE_OTHER): Payer: Medicaid Other | Admitting: Obstetrics & Gynecology

## 2014-03-27 ENCOUNTER — Encounter: Payer: Self-pay | Admitting: Obstetrics & Gynecology

## 2014-03-27 VITALS — BP 130/84 | HR 89 | Resp 16 | Ht 66.0 in | Wt 148.0 lb

## 2014-03-27 DIAGNOSIS — Z01812 Encounter for preprocedural laboratory examination: Secondary | ICD-10-CM

## 2014-03-27 DIAGNOSIS — Z30430 Encounter for insertion of intrauterine contraceptive device: Secondary | ICD-10-CM

## 2014-03-27 DIAGNOSIS — IMO0001 Reserved for inherently not codable concepts without codable children: Secondary | ICD-10-CM

## 2014-03-27 LAB — POCT URINE PREGNANCY: PREG TEST UR: NEGATIVE

## 2014-03-27 MED ORDER — LEVONORGESTREL 20 MCG/24HR IU IUD
1.0000 | INTRAUTERINE_SYSTEM | Freq: Once | INTRAUTERINE | Status: AC
  Administered 2014-03-27: 1 via INTRAUTERINE

## 2014-03-27 NOTE — Progress Notes (Signed)
   Subjective:    Patient ID: [name] SicksJuliana White, female    DOB: 1996/04/02, 18 y.o.   MRN: [id number]  HPI  18 yo S [place] P1 with a 19 yo son who would like to have Mirena for contraception. She has a friend who is happy with this form of contraception. She currently uses condoms with her BF of 3 years.  Review of Systems     Objective:   Physical Exam  UPT negative, consent signed, Time out procedure done. Cervix prepped with betadine  Mirena was easily placed and the strings were cut to 3-4 cm. Uterus sounded to 9 cm. She tolerated the procedure well.        Assessment & Plan:  Contraception- Mirena RTC 1 month for string check

## 2014-05-03 ENCOUNTER — Ambulatory Visit (INDEPENDENT_AMBULATORY_CARE_PROVIDER_SITE_OTHER): Payer: Medicaid Other | Admitting: Obstetrics & Gynecology

## 2014-05-03 ENCOUNTER — Encounter: Payer: Self-pay | Admitting: Obstetrics & Gynecology

## 2014-05-03 VITALS — BP 123/66 | HR 85 | Resp 16 | Ht 66.0 in | Wt 142.0 lb

## 2014-05-03 DIAGNOSIS — Z30431 Encounter for routine checking of intrauterine contraceptive device: Secondary | ICD-10-CM

## 2014-05-03 NOTE — Progress Notes (Signed)
   Subjective:    Patient ID: [name] White, female    DOB: 1995/12/19, 18 y.o.   MRN: [id number]  HPI  Pt here for string check.  IUD placed just over a month ago.  Has had menstruation lasting 7 days.  Pt reassured this was nml  Review of Systems  7 day menstruation.    Objective:   Physical Exam  Vitals reviewed. Constitutional: She is oriented to person, place, and time. She appears well-developed and well-nourished. No distress.  HENT:  Head: Normocephalic and atraumatic.  Eyes: Conjunctivae are normal.  Pulmonary/Chest: Effort normal.  Abdominal: Soft.  Genitourinary: Uterus normal.  2 cm strings seen  Musculoskeletal: She exhibits no edema.  Neurological: She is alert and oriented to person, place, and time.  Skin: Skin is warm and dry.  Psychiatric: She has a normal mood and affect.          Assessment & Plan:  18 yo female with IUD in correct place.  RTC as needed.

## 2014-06-22 ENCOUNTER — Emergency Department (HOSPITAL_COMMUNITY)
Admission: EM | Admit: 2014-06-22 | Discharge: 2014-06-22 | Disposition: A | Discharge status: Home / Self Care | Payer: Medicaid Other | Attending: Emergency Medicine | Admitting: Emergency Medicine

## 2014-06-22 ENCOUNTER — Encounter (HOSPITAL_COMMUNITY): Payer: Self-pay | Admitting: Emergency Medicine

## 2014-06-22 DIAGNOSIS — S0510XA Contusion of eyeball and orbital tissues, unspecified eye, initial encounter: Secondary | ICD-10-CM | POA: Diagnosis not present

## 2014-06-22 DIAGNOSIS — H5789 Other specified disorders of eye and adnexa: Secondary | ICD-10-CM

## 2014-06-22 DIAGNOSIS — Z794 Long term (current) use of insulin: Secondary | ICD-10-CM | POA: Diagnosis not present

## 2014-06-22 DIAGNOSIS — E119 Type 2 diabetes mellitus without complications: Secondary | ICD-10-CM | POA: Insufficient documentation

## 2014-06-22 MED ORDER — TETRACAINE HCL 0.5 % OP SOLN
1.0000 [drp] | Freq: Once | OPHTHALMIC | Status: AC
  Administered 2014-06-22: 1 [drp] via OPHTHALMIC
  Filled 2014-06-22: qty 2

## 2014-06-22 MED ORDER — KETOROLAC TROMETHAMINE 0.5 % OP SOLN: 1.0000 [drp] | Freq: Four times a day (QID) | OPHTHALMIC | Status: DC

## 2014-06-22 MED ORDER — FLUORESCEIN SODIUM 1 MG OP STRP
1.0000 | ORAL_STRIP | Freq: Once | OPHTHALMIC | Status: AC
  Administered 2014-06-22: 1 via OPHTHALMIC
  Filled 2014-06-22: qty 1
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
## 2014-06-22 NOTE — ED Provider Notes (Signed)
Medical screening examination/treatment/procedure(s) were performed by non-physician practitioner and as supervising physician I was immediately available for consultation/collaboration.   EKG Interpretation None        Lyanne CoKevin M Rito Lecomte, MD 06/22/14 2212
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
## 2014-06-22 NOTE — Discharge Instructions (Signed)
Take the prescribed medication as directed. °Follow-up with eye doctor if problems occur. °Return to the ED for new or worsening symptoms. ° °

## 2014-09-10 ENCOUNTER — Encounter (HOSPITAL_COMMUNITY): Payer: Self-pay | Admitting: Emergency Medicine

## 2014-11-12 ENCOUNTER — Encounter (HOSPITAL_COMMUNITY): Payer: Self-pay | Admitting: Cardiology

## 2014-11-12 ENCOUNTER — Emergency Department (HOSPITAL_COMMUNITY)
Admission: EM | Admit: 2014-11-12 | Discharge: 2014-11-12 | Discharge status: Against Medical Advice | Payer: Medicaid Other | Attending: Emergency Medicine | Admitting: Emergency Medicine

## 2014-11-12 ENCOUNTER — Telehealth: Payer: Self-pay | Admitting: Internal Medicine

## 2014-11-12 DIAGNOSIS — K088 Other specified disorders of teeth and supporting structures: Secondary | ICD-10-CM | POA: Insufficient documentation

## 2014-11-12 DIAGNOSIS — E119 Type 2 diabetes mellitus without complications: Secondary | ICD-10-CM | POA: Diagnosis not present

## 2014-11-12 NOTE — Telephone Encounter (Signed)
Patient came into facility to request a med refill for her insulin, patient states that she went to the ED but left without being seen, patient scheduled an appointment for 11/15/2014 with her PCP, but does not currently have any insulin. Please f/u with pt. For refill.

## 2014-11-12 NOTE — ED Notes (Signed)
Pt reports right sided dental pain for the past couple of days.

## 2014-11-13 ENCOUNTER — Other Ambulatory Visit: Payer: Self-pay | Admitting: Emergency Medicine

## 2014-11-13 ENCOUNTER — Telehealth: Payer: Self-pay | Admitting: Emergency Medicine

## 2014-11-13 MED ORDER — INSULIN NPH ISOPHANE & REGULAR (70-30) 100 UNIT/ML ~~LOC~~ SUSP: 25.0000 [IU] | Freq: Two times a day (BID) | SUBCUTANEOUS | Status: DC

## 2014-11-13 NOTE — Telephone Encounter (Signed)
Attempted to reach pt with request insulin Sent to [facility] pharmacy Both numbers disconnected

## 2014-11-15 ENCOUNTER — Encounter: Payer: Self-pay | Admitting: Internal Medicine

## 2014-11-15 ENCOUNTER — Ambulatory Visit: Payer: Medicaid Other | Attending: Internal Medicine | Admitting: Internal Medicine

## 2014-11-15 VITALS — BP 118/77 | HR 89 | Temp 98.8°F | Resp 16 | Ht 66.0 in | Wt 138.0 lb

## 2014-11-15 DIAGNOSIS — E1065 Type 1 diabetes mellitus with hyperglycemia: Secondary | ICD-10-CM | POA: Insufficient documentation

## 2014-11-15 DIAGNOSIS — R7989 Other specified abnormal findings of blood chemistry: Secondary | ICD-10-CM | POA: Insufficient documentation

## 2014-11-15 DIAGNOSIS — R946 Abnormal results of thyroid function studies: Secondary | ICD-10-CM | POA: Diagnosis not present

## 2014-11-15 DIAGNOSIS — Z794 Long term (current) use of insulin: Secondary | ICD-10-CM | POA: Diagnosis not present

## 2014-11-15 DIAGNOSIS — M79605 Pain in left leg: Secondary | ICD-10-CM | POA: Diagnosis not present

## 2014-11-15 DIAGNOSIS — F17200 Nicotine dependence, unspecified, uncomplicated: Secondary | ICD-10-CM | POA: Diagnosis not present

## 2014-11-15 DIAGNOSIS — E109 Type 1 diabetes mellitus without complications: Secondary | ICD-10-CM

## 2014-11-15 DIAGNOSIS — M7989 Other specified soft tissue disorders: Secondary | ICD-10-CM | POA: Diagnosis not present

## 2014-11-15 DIAGNOSIS — M79604 Pain in right leg: Secondary | ICD-10-CM | POA: Insufficient documentation

## 2014-11-15 LAB — COMPLETE METABOLIC PANEL WITH GFR
ALBUMIN: 4 g/dL (ref 3.5–5.2)
ALK PHOS: 125 U/L — AB (ref 39–117)
ALT: 20 U/L (ref 0–35)
AST: 19 U/L (ref 0–37)
BUN: 14 mg/dL (ref 6–23)
CHLORIDE: 94 meq/L — AB (ref 96–112)
CO2: 25 meq/L (ref 19–32)
Calcium: 9.6 mg/dL (ref 8.4–10.5)
Creat: 0.57 mg/dL (ref 0.50–1.10)
GLUCOSE: 550 mg/dL — AB (ref 70–99)
Potassium: 4.5 mEq/L (ref 3.5–5.3)
Sodium: 130 mEq/L — ABNORMAL LOW (ref 135–145)
TOTAL PROTEIN: 7.3 g/dL (ref 6.0–8.3)
Total Bilirubin: 0.4 mg/dL (ref 0.2–1.1)

## 2014-11-15 LAB — GLUCOSE, POCT (MANUAL RESULT ENTRY)
POC Glucose: 357 mg/dl — AB (ref 70–99)
POC Glucose: 598 mg/dl — AB (ref 70–99)

## 2014-11-15 LAB — CBC WITH DIFFERENTIAL/PLATELET
Basophils Absolute: 0 10*3/uL (ref 0.0–0.1)
Basophils Relative: 0 % (ref 0–1)
EOS ABS: 0.1 10*3/uL (ref 0.0–0.7)
Eosinophils Relative: 1 % (ref 0–5)
HCT: 40.6 % (ref 36.0–46.0)
Hemoglobin: 13.3 g/dL (ref 12.0–15.0)
LYMPHS ABS: 2.3 10*3/uL (ref 0.7–4.0)
Lymphocytes Relative: 32 % (ref 12–46)
MCH: 27 pg (ref 26.0–34.0)
MCHC: 32.8 g/dL (ref 30.0–36.0)
MCV: 82.4 fL (ref 78.0–100.0)
MPV: 10.6 fL (ref 8.6–12.4)
Monocytes Absolute: 0.5 10*3/uL (ref 0.1–1.0)
Monocytes Relative: 7 % (ref 3–12)
NEUTROS PCT: 60 % (ref 43–77)
Neutro Abs: 4.4 10*3/uL (ref 1.7–7.7)
Platelets: 276 10*3/uL (ref 150–400)
RBC: 4.93 MIL/uL (ref 3.87–5.11)
RDW: 12.5 % (ref 11.5–15.5)
WBC: 7.3 10*3/uL (ref 4.0–10.5)

## 2014-11-15 LAB — LIPID PANEL
Cholesterol: 195 mg/dL (ref 0–200)
HDL: 47 mg/dL (ref >39)
LDL CALC: 123 mg/dL — AB (ref 0–99)
Total CHOL/HDL Ratio: 4.1 Ratio
Triglycerides: 123 mg/dL (ref <150)
VLDL: 25 mg/dL (ref 0–40)

## 2014-11-15 LAB — T4, FREE: Free T4: 1.27 ng/dL (ref 0.80–1.80)

## 2014-11-15 LAB — POCT GLYCOSYLATED HEMOGLOBIN (HGB A1C): Hemoglobin A1C: >15

## 2014-11-15 LAB — T3, FREE: T3, Free: 2.8 pg/mL (ref 2.3–4.2)

## 2014-11-15 LAB — TSH: TSH: 0.56 u[IU]/mL (ref 0.350–4.500)

## 2014-11-15 MED ORDER — GLUCOSE BLOOD VI STRP: ORAL_STRIP | Status: AC

## 2014-11-15 MED ORDER — INSULIN GLARGINE 100 UNIT/ML SOLOSTAR PEN: 30.0000 [IU] | PEN_INJECTOR | Freq: Every day | SUBCUTANEOUS | Status: DC

## 2014-11-15 MED ORDER — "INSULIN SYRINGE-NEEDLE U-100 30G X 5/16"" 0.5 ML MISC": Status: DC

## 2014-11-15 MED ORDER — FREESTYLE LANCETS MISC: Status: AC

## 2014-11-15 NOTE — Patient Instructions (Signed)
Diabetes and Exercise Exercising regularly is important. It is not just about losing weight. It has many health benefits, such as:  Improving your overall fitness, flexibility, and endurance.  Increasing your bone density.  Helping with weight control.  Decreasing your body fat.  Increasing your muscle strength.  Reducing stress and tension.  Improving your overall health. People with diabetes who exercise gain additional benefits because exercise:  Reduces appetite.  Improves the body's use of blood sugar (glucose).  Helps lower or control blood glucose.  Decreases blood pressure.  Helps control blood lipids (such as cholesterol and triglycerides).  Improves the body's use of the hormone insulin by:  Increasing the body's insulin sensitivity.  Reducing the body's insulin needs.  Decreases the risk for heart disease because exercising:  Lowers cholesterol and triglycerides levels.  Increases the levels of good cholesterol (such as high-density lipoproteins [HDL]) in the body.  Lowers blood glucose levels. YOUR ACTIVITY PLAN  Choose an activity that you enjoy and set realistic goals. Your health care provider or diabetes educator can help you make an activity plan that works for you. Exercise regularly as directed by your health care provider. This includes:  Performing resistance training twice a week such as push-ups, sit-ups, lifting weights, or using resistance bands.  Performing 150 minutes of cardio exercises each week such as walking, running, or playing sports.  Staying active and spending no more than 90 minutes at one time being inactive. Even short bursts of exercise are good for you. Three 10-minute sessions spread throughout the day are just as beneficial as a single 30-minute session. Some exercise ideas include:  Taking the dog for a walk.  Taking the stairs instead of the elevator.  Dancing to your favorite song.  Doing an exercise  video.  Doing your favorite exercise with a friend. RECOMMENDATIONS FOR EXERCISING WITH TYPE 1 OR TYPE 2 DIABETES   Check your blood glucose before exercising. If blood glucose levels are greater than 240 mg/dL, check for urine ketones. Do not exercise if ketones are present.  Avoid injecting insulin into areas of the body that are going to be exercised. For example, avoid injecting insulin into:  The arms when playing tennis.  The legs when jogging.  Keep a record of:  Food intake before and after you exercise.  Expected peak times of insulin action.  Blood glucose levels before and after you exercise.  The type and amount of exercise you have done.  Review your records with your health care provider. Your health care provider will help you to develop guidelines for adjusting food intake and insulin amounts before and after exercising.  If you take insulin or oral hypoglycemic agents, watch for signs and symptoms of hypoglycemia. They include:  Dizziness.  Shaking.  Sweating.  Chills.  Confusion.  Drink plenty of water while you exercise to prevent dehydration or heat stroke. Body water is lost during exercise and must be replaced.  Talk to your health care provider before starting an exercise program to make sure it is safe for you. Remember, almost any type of activity is better than none. Document Released: 01/16/2004 Document Revised: 03/12/2014 Document Reviewed: 04/04/2013 ExitCare Patient Information 2015 [employer]. This information is not intended to replace advice given to you by your health care provider. Make sure you discuss any questions you have with your health care provider. Basic Carbohydrate Counting for Diabetes Mellitus Carbohydrate counting is a method for keeping track of the amount of carbohydrates you eat.   Eating carbohydrates naturally increases the level of sugar (glucose) in your blood, so it is important for you to know the amount that is  okay for you to have in every meal. Carbohydrate counting helps keep the level of glucose in your blood within normal limits. The amount of carbohydrates allowed is different for every person. A dietitian can help you calculate the amount that is right for you. Once you know the amount of carbohydrates you can have, you can count the carbohydrates in the foods you want to eat. Carbohydrates are found in the following foods:  Grains, such as breads and cereals.  Dried beans and soy products.  Starchy vegetables, such as potatoes, peas, and corn.  Fruit and fruit juices.  Milk and yogurt.  Sweets and snack foods, such as cake, cookies, candy, chips, soft drinks, and fruit drinks. CARBOHYDRATE COUNTING There are two ways to count the carbohydrates in your food. You can use either of the methods or a combination of both. Reading the "Nutrition Facts" on Packaged Food The "Nutrition Facts" is an area that is included on the labels of almost all packaged food and beverages in the [place]. It includes the serving size of that food or beverage and information about the nutrients in each serving of the food, including the grams (g) of carbohydrate per serving.  Decide the number of servings of this food or beverage that you will be able to eat or drink. Multiply that number of servings by the number of grams of carbohydrate that is listed on the label for that serving. The total will be the amount of carbohydrates you will be having when you eat or drink this food or beverage. Learning Standard Serving Sizes of Food When you eat food that is not packaged or does not include "Nutrition Facts" on the label, you need to measure the servings in order to count the amount of carbohydrates.A serving of most carbohydrate-rich foods contains about 15 g of carbohydrates. The following list includes serving sizes of carbohydrate-rich foods that provide 15 g ofcarbohydrate per serving:   1 slice of bread  (1 oz) or 1 six-inch tortilla.    of a hamburger bun or English muffin.  4-6 crackers.   cup unsweetened dry cereal.    cup hot cereal.   cup rice or pasta.    cup mashed potatoes or  of a large baked potato.  1 cup fresh fruit or one small piece of fruit.    cup canned or frozen fruit or fruit juice.  1 cup milk.   cup plain fat-free yogurt or yogurt sweetened with artificial sweeteners.   cup cooked dried beans or starchy vegetable, such as peas, corn, or potatoes.  Decide the number of standard-size servings that you will eat. Multiply that number of servings by 15 (the grams of carbohydrates in that serving). For example, if you eat 2 cups of strawberries, you will have eaten 2 servings and 30 g of carbohydrates (2 servings x 15 g = 30 g). For foods such as soups and casseroles, in which more than one food is mixed in, you will need to count the carbohydrates in each food that is included. EXAMPLE OF CARBOHYDRATE COUNTING Sample Dinner  3 oz chicken breast.   cup of brown rice.   cup of corn.  1 cup milk.   1 cup strawberries with sugar-free whipped topping.  Carbohydrate Calculation Step 1: Identify the foods that contain carbohydrates:   Rice.   Corn.     Milk.   Strawberries. Step 2:Calculate the number of servings eaten of each:   2 servings of rice.   1 serving of corn.   1 serving of milk.   1 serving of strawberries. Step 3: Multiply each of those number of servings by 15 g:   2 servings of rice x 15 g = 30 g.   1 serving of corn x 15 g = 15 g.   1 serving of milk x 15 g = 15 g.   1 serving of strawberries x 15 g = 15 g. Step 4: Add together all of the amounts to find the total grams of carbohydrates eaten: 30 g + 15 g + 15 g + 15 g = 75 g. Document Released: 10/26/2005 Document Revised: 03/12/2014 Document Reviewed: 09/22/2013 ExitCare Patient Information 2015 [employer]. This information is not intended to  replace advice given to you by your health care provider. Make sure you discuss any questions you have with your health care provider.  

## 2014-11-15 NOTE — Progress Notes (Signed)
Pt is here today following up on her diabetes. Pt states that she is on her feet all night and now for a few weeks she is having sharp pain in both of her feet. Pt has not had her insulin for a month.

## 2014-11-15 NOTE — Progress Notes (Signed)
Patient ID: [name] White, female   DOB: 1996/07/13, 19 y.o.   MRN: [id number]   [name] White, is a 19 y.o. female  [id number]  [id number]  DOB - 1996/07/13  Chief Complaint  Patient presents with  . Follow-up        Subjective:   [name] White is a 19 y.o. female here today for a follow up visit. Patient has history of uncontrolled type 1 diabetes mellitus was to follow-up since March 2015 when she came to establish care for the first time, here today for follow-up. Her major complaint today is constant bilateral leg pain aggravated by standing for prolonged period of time. There is associated mild leg swelling. Denies numbness, no shortness of breath, no chest pain. No history of DVT. Patient is currently on 20 units Lantus insulin and NovoLog every 4 hours. Patient smokes one cigar per day, denies alcohol use. Patient has No headache, No abdominal pain - No Nausea, No new weakness tingling or numbness, No Cough - SOB.  Problem  Low Tsh Level    ALLERGIES: No Known Allergies  PAST MEDICAL HISTORY: Past Medical History  Diagnosis Date  . Diabetes mellitus without complication     had since she was [age]    MEDICATIONS AT HOME: Prior to Admission medications   Medication Sig Start Date End Date Taking? Authorizing Provider  glucose blood test strip Use as instructed 01/12/14  Yes Richarda OverlieNayana Abrol, MD  insulin NPH-regular Human (NOVOLIN 70/30) (70-30) 100 UNIT/ML injection Inject 25 Units into the skin 2 (two) times daily with a meal. 11/13/14  Yes Quentin Angstlugbemiga E Kamya Watling, MD  ketorolac (ACULAR) 0.5 % ophthalmic solution Place 1 drop into both eyes 4 (four) times daily. 06/22/14  Yes Garlon HatchetLisa M Sanders, PA-C  glucose blood (FREESTYLE LITE) test strip Use as instructed 11/15/14   Quentin Angstlugbemiga E Jaze Rodino, MD  Insulin Glargine (LANTUS SOLOSTAR) 100 UNIT/ML Solostar Pen Inject 30 Units into the skin daily at 10 pm. 11/15/14   Quentin Angstlugbemiga E Effrey Davidow, MD  Insulin Syringe-Needle U-100 30G X 5/16"  0.5 ML MISC Test blood sugar 3 times per day 11/15/14   Quentin Angstlugbemiga E Kabella Cassidy, MD  Lancets (FREESTYLE) lancets Use as instructed 11/15/14   Quentin Angstlugbemiga E Ife Vitelli, MD     Objective:   Filed Vitals:   11/15/14 1053  BP: 118/77  Pulse: 89  Temp: 98.8 F (37.1 C)  TempSrc: Oral  Resp: 16  Height: 5\' 6"  (1.676 m)  Weight: 138 lb (62.596 kg)  SpO2: 98%    Exam General appearance : Awake, alert, not in any distress. Speech Clear. Not toxic looking HEENT: Atraumatic and Normocephalic, pupils equally reactive to light and accomodation Neck: supple, no JVD. No cervical lymphadenopathy.  Chest:Good air entry bilaterally, no added sounds  CVS: S1 S2 regular, no murmurs.  Abdomen: Bowel sounds present, Non tender and not distended with no gaurding, rigidity or rebound. Extremities: B/L Lower Ext shows no edema, both legs are warm to touch Neurology: Awake alert, and oriented X 3, CN II-XII intact, Non focal Skin:No Rash Wounds:N/A  Data Review Lab Results  Component Value Date   HGBA1C >15.0 11/15/2014   HGBA1C 14.0 12/21/2013     Assessment & Plan   1. Type 1 diabetes mellitus without complication, out of control  - Glucose (CBG) - HgB A1c is greater than 15%  - CBC with Differential - COMPLETE METABOLIC PANEL WITH GFR - Lipid panel - TSH - Urinalysis, Complete Increase Lantus insulin to 30 units daily at bedtime  from 20 units. - Insulin Glargine (LANTUS SOLOSTAR) 100 UNIT/ML Solostar Pen; Inject 30 Units into the skin daily at 10 pm.  Dispense: 5 pen; Refill: 3 - Lancets (FREESTYLE) lancets; Use as instructed  Dispense: 100 each; Refill: 12 - glucose blood (FREESTYLE LITE) test strip; Use as instructed  Dispense: 100 each; Refill: 12 - For home use only DME Glucometer - Insulin Syringe-Needle U-100 30G X 5/16" 0.5 ML MISC; Test blood sugar 3 times per day  Dispense: 100 each; Refill: 3   Aim for 2-3 Carb Choices per meal (30-45 grams) +/- 1 either way  Aim for 0-15 Carbs per  snack if hungry  Include protein in moderation with your meals and snacks  Consider reading food labels for Total Carbohydrate and Fat Grams of foods  Consider checking BG at alternate times per day  Continue taking medication as directed Fruit Punch - find one with no sugar  Measure and decrease portions of carbohydrate foods  Make your plate and don't go back for seconds   2. Low TSH level  - T3, Free - T4, Free - Thyroid Peroxidase Antibody - Thyroglobulin antibody   Patient was counseled extensively about nutrition and exercise.  The patient was counseled on the dangers of tobacco use, and was advised to quit. Reviewed strategies to maximize success, including removing cigarettes and smoking materials from environment, stress management and support of family/friends.   Return in about 2 weeks (around 11/29/2014), or if symptoms worsen or fail to improve, for BP Check, Nurse Visit.  The patient was given clear instructions to go to ER or return to medical center if symptoms don't improve, worsen or new problems develop. The patient verbalized understanding. The patient was told to call to get lab results if they haven't heard anything in the next week.   This note has been created with Education officer, environmental. Any transcriptional errors are unintentional.    Jeanann Lewandowsky, MD, MHA, FACP, FAAP [facility] and [facility] [place], [place] [phone]   11/15/2014, 11:29 AM

## 2014-11-16 LAB — URINALYSIS, COMPLETE
Bilirubin Urine: NEGATIVE
CRYSTALS: NONE SEEN
Casts: NONE SEEN
Hgb urine dipstick: NEGATIVE
KETONES UR: 40 mg/dL — AB
Leukocytes, UA: NEGATIVE
Nitrite: NEGATIVE
PROTEIN: NEGATIVE mg/dL
SPECIFIC GRAVITY, URINE: 1.029 (ref 1.005–1.030)
UROBILINOGEN UA: 0.2 mg/dL (ref 0.0–1.0)
pH: 5.5 (ref 5.0–8.0)

## 2014-11-16 LAB — THYROGLOBULIN ANTIBODY

## 2014-11-16 LAB — THYROID PEROXIDASE ANTIBODY: Thyroperoxidase Ab SerPl-aCnc: 1 IU/mL (ref <9)

## 2014-11-22 ENCOUNTER — Emergency Department (HOSPITAL_COMMUNITY)
Admission: EM | Admit: 2014-11-22 | Discharge: 2014-11-22 | Disposition: A | Discharge status: Home / Self Care | Payer: Medicaid Other | Attending: Emergency Medicine | Admitting: Emergency Medicine

## 2014-11-22 ENCOUNTER — Encounter (HOSPITAL_COMMUNITY): Payer: Self-pay | Admitting: Emergency Medicine

## 2014-11-22 DIAGNOSIS — Z79899 Other long term (current) drug therapy: Secondary | ICD-10-CM | POA: Insufficient documentation

## 2014-11-22 DIAGNOSIS — M79604 Pain in right leg: Secondary | ICD-10-CM | POA: Diagnosis not present

## 2014-11-22 DIAGNOSIS — Z794 Long term (current) use of insulin: Secondary | ICD-10-CM | POA: Insufficient documentation

## 2014-11-22 DIAGNOSIS — E119 Type 2 diabetes mellitus without complications: Secondary | ICD-10-CM | POA: Diagnosis not present

## 2014-11-22 DIAGNOSIS — M79605 Pain in left leg: Secondary | ICD-10-CM | POA: Diagnosis not present

## 2014-11-22 MED ORDER — IBUPROFEN 800 MG PO TABS: 800.0000 mg | ORAL_TABLET | Freq: Three times a day (TID) | ORAL | Status: DC | PRN

## 2014-11-22 MED ORDER — IBUPROFEN 400 MG PO TABS
800.0000 mg | ORAL_TABLET | Freq: Once | ORAL | Status: AC
  Administered 2014-11-22: 800 mg via ORAL
  Filled 2014-11-22: qty 2

## 2014-11-22 NOTE — ED Notes (Signed)
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

## 2014-11-22 NOTE — ED Notes (Signed)
C/o bilateral lower leg pain. Onset today, no known injury.

## 2014-11-22 NOTE — Discharge Instructions (Signed)

## 2014-11-22 NOTE — ED Provider Notes (Signed)
CSN: [id number]     Arrival date & time 11/22/14  1312 History  This chart was scribed for non-physician practitioner, Fayrene HelperBowie Sidney Silberman, PA-C, working with Juliet RudeNathan R. Rubin PayorPickering, MD by Charline BillsEssence Howell, ED Scribe. This patient was seen in room TR09C/TR09C and the patient's care was started at 3:12 PM.   Chief Complaint  Patient presents with  . Foot Pain   The history is provided by the patient. No language interpreter was used.   HPI Comments: [name] White is a 19 y.o. female, with a h/o DM, who presents to the Emergency Department complaining of constant bilateral leg pain onset today. Pain is exacerbated with standing. She states that she has worked a job where she stands for 12 hours for the past year. She reports associated leg swelling. Pt denies hemoptysis, numbness, chest pain, SOB. She also denies recent surgeries or recent long trips. No h/o CA or DVT. She denies current pregnancy. Pt reports IUD placement, no oral BC. No treatments tried. No known allergies.   Past Medical History  Diagnosis Date  . Diabetes mellitus without complication     had since she was 16   Past Surgical History  Procedure Laterality Date  . Incision and drainage of peritonsillar abcess N/A 12/10/2013    Procedure: INCISION AND DRAINAGE OF PERITONSILLAR ABCESS;  Surgeon: Flo ShanksKarol Wolicki, MD;  [facility];  Service: ENT;  Laterality: N/A;  . Tonsillectomy Left 12/10/2013    Procedure: TONSILLECTOMY;  Surgeon: Flo ShanksKarol Wolicki, MD;  [facility];  Service: ENT;  Laterality: Left;  . Tonsillectomy  12/10/2013  . Mass biopsy Right 12/29/2013    Procedure: IRRIGATION AND DEBRIDEMENT RIGHT NECK   ;  Surgeon: Flo ShanksKarol Wolicki, MD;  [facility];  Service: ENT;  Laterality: Right;  . Tracheostomy tube placement N/A 12/31/2013    Procedure: TRACHEOSTOMY;  Surgeon: Christia Readingwight Bates, MD;  [facility];  Service: ENT;  Laterality: N/A;   Family History  Problem Relation Age of Onset  . Diabetes type I Mother    History   Substance Use Topics  . Smoking status: Never Smoker   . Smokeless tobacco: Never Used  . Alcohol Use: No   OB History    Gravida Para Term Preterm AB TAB SAB Ectopic Multiple Living   1 1 1  0 0 0 0 0 0 1     Review of Systems  Respiratory: Negative for shortness of breath.   Cardiovascular: Positive for leg swelling. Negative for chest pain.  Musculoskeletal: Positive for myalgias.   Allergies  Review of patient's allergies indicates no known allergies.  Home Medications   Prior to Admission medications   Medication Sig Start Date End Date Taking? Authorizing Provider  glucose blood (FREESTYLE LITE) test strip Use as instructed 11/15/14   Quentin Angstlugbemiga E Jegede, MD  glucose blood test strip Use as instructed 01/12/14   Richarda OverlieNayana Abrol, MD  Insulin Glargine (LANTUS SOLOSTAR) 100 UNIT/ML Solostar Pen Inject 30 Units into the skin daily at 10 pm. 11/15/14   Quentin Angstlugbemiga E Jegede, MD  insulin NPH-regular Human (NOVOLIN 70/30) (70-30) 100 UNIT/ML injection Inject 25 Units into the skin 2 (two) times daily with a meal. 11/13/14   Quentin Angstlugbemiga E Jegede, MD  Insulin Syringe-Needle U-100 30G X 5/16" 0.5 ML MISC Test blood sugar 3 times per day 11/15/14   Quentin Angstlugbemiga E Jegede, MD  ketorolac (ACULAR) 0.5 % ophthalmic solution Place 1 drop into both eyes 4 (four) times daily. 06/22/14   Garlon HatchetLisa M Sanders, PA-C  Lancets (FREESTYLE) lancets Use as instructed 11/15/14   Quentin Angst, MD   Triage Vitals: BP 121/63 mmHg  Pulse 85  Temp(Src) 98.4 F (36.9 C) (Oral)  Resp 18  Ht  (1.676 m)  Wt 138 lb (62.596 kg)  BMI 22.28 kg/m2  SpO2 100%  LMP 10/15/2014 Physical Exam  Constitutional: She is oriented to person, place, and time. She appears well-developed and well-nourished. No distress.  HENT:  Head: Normocephalic and atraumatic.  Eyes: Conjunctivae and EOM are normal.  Neck: Neck supple.  Cardiovascular: Normal rate.   Musculoskeletal: Normal range of motion.  No focal tenderness throughout  bilateral lower extremities. Ankles with full ROM bilaterally. Feet are nontender bilaterally. Pedal pulse intact with normal sensation throughout. Bilateral knees with normal flexion and extension. No edema, erythema or signs of infections to lower extremities.   Neurological: She is alert and oriented to person, place, and time.  Skin: Skin is warm and dry.  Psychiatric: She has a normal mood and affect. Her behavior is normal.  Nursing note and vitals reviewed.  ED Course  Procedures (including critical care time) DIAGNOSTIC STUDIES: Oxygen Saturation is 100% on RA, normal by my interpretation.    COORDINATION OF CARE: 3:17 PM-Pt with bilateral leg pain, likely MSK as pt report standing for prolonged period of time.  no evidence to suggest DVT or infection.  No joint problem.  Discussed treatment plan which includes ibuprofen with pt at bedside and pt agreed to plan.   Labs Review Labs Reviewed - No data to display  Imaging Review No results found.   EKG Interpretation None      MDM   Final diagnoses:  Bilateral leg pain    BP 121/63 mmHg  Pulse 85  Temp(Src) 98.4 F (36.9 C) (Oral)  Resp 18  Ht  (1.676 m)  Wt 138 lb (62.596 kg)  BMI 22.28 kg/m2  SpO2 100%  LMP 10/15/2014   I personally performed the services described in this documentation, which was scribed in my presence. The recorded information has been reviewed and is accurate.    Fayrene Helper, PA-C 11/22/14 1524  Juliet Rude. Rubin Payor, MD 11/23/14 [phone]

## 2014-11-23 ENCOUNTER — Telehealth: Payer: Self-pay | Admitting: Emergency Medicine

## 2014-11-23 NOTE — Telephone Encounter (Signed)
Pt given lab results with instructions to keep cbg log at home and bring to next Tues nurse visit Pt verbalized understanding

## 2014-11-23 NOTE — Telephone Encounter (Signed)
-----   Message from Quentin Angstlugbemiga E Jegede, MD sent at 11/16/2014  9:21 AM EST ----- Please inform patient that her laboratory test results revealed normal thyroid function, blood sugar is very high. Encouraged patient to keep blood sugar log at home and bring log to clinic for nurse visit in one week as discussed

## 2014-11-27 ENCOUNTER — Ambulatory Visit: Payer: Medicaid Other | Admitting: Obstetrics & Gynecology

## 2014-11-27 ENCOUNTER — Ambulatory Visit: Payer: Medicaid Other | Attending: Internal Medicine | Admitting: *Deleted

## 2014-11-27 ENCOUNTER — Inpatient Hospital Stay (HOSPITAL_COMMUNITY)
Admission: AD | Admit: 2014-11-27 | Discharge: 2014-11-27 | Disposition: A | Discharge status: Home / Self Care | Payer: Medicaid Other | Source: Ambulatory Visit | Attending: Obstetrics and Gynecology | Admitting: Obstetrics and Gynecology

## 2014-11-27 VITALS — BP 109/68 | HR 82 | Temp 98.4°F | Resp 16

## 2014-11-27 DIAGNOSIS — N910 Primary amenorrhea: Secondary | ICD-10-CM | POA: Diagnosis not present

## 2014-11-27 DIAGNOSIS — Z794 Long term (current) use of insulin: Secondary | ICD-10-CM | POA: Diagnosis not present

## 2014-11-27 DIAGNOSIS — E1065 Type 1 diabetes mellitus with hyperglycemia: Secondary | ICD-10-CM | POA: Diagnosis not present

## 2014-11-27 LAB — GLUCOSE, POCT (MANUAL RESULT ENTRY): POC GLUCOSE: 98 mg/dL (ref 70–99)

## 2014-11-27 LAB — POCT URINE PREGNANCY: Preg Test, Ur: NEGATIVE

## 2014-11-27 NOTE — Progress Notes (Signed)
Patient presents for CBG and record review. Patient does not record in log but brought meter Meter is not set to correct time so unable to know which readings are fasting Given log and educated on use. Instructed patient to record blood sugar readings AM fasting and before dinner every day. Instructed pt to bring log to every visit Med list reviewed; patient reports taking all meds as directed Smoking 1 cig/day; advised to quit States LMP 10/09/14 C/O 1-2 week hx LE edema; States stands 8-12 hours/day 5 days/week for job  Per glucometer: Patient's 7 day avg blood sugars 229 Patient's 14 day avg blood sugars 278 Patient's 30 day avg blood sugars 2278 Lowest blood sugar 113 Highest blood sugar 505   CBG 98 Urine preg: Negative  BP 109/68 P 82 R 16 T 98.4 oral SpO2 100%  Per Provider: Record blood sugars and return in 2 weeks for record review Elevate lower extremities while sitting  Patient advised to call for med refills at least 7 days before running out so as not to go without. t

## 2014-12-03 ENCOUNTER — Emergency Department (HOSPITAL_COMMUNITY)
Admission: EM | Admit: 2014-12-03 | Discharge: 2014-12-03 | Disposition: A | Discharge status: Home / Self Care | Payer: Medicaid Other | Attending: Emergency Medicine | Admitting: Emergency Medicine

## 2014-12-03 ENCOUNTER — Encounter (HOSPITAL_COMMUNITY): Payer: Self-pay | Admitting: *Deleted

## 2014-12-03 DIAGNOSIS — Z720 Tobacco use: Secondary | ICD-10-CM | POA: Diagnosis not present

## 2014-12-03 DIAGNOSIS — R0789 Other chest pain: Secondary | ICD-10-CM

## 2014-12-03 DIAGNOSIS — Z794 Long term (current) use of insulin: Secondary | ICD-10-CM | POA: Insufficient documentation

## 2014-12-03 DIAGNOSIS — E119 Type 2 diabetes mellitus without complications: Secondary | ICD-10-CM | POA: Diagnosis not present

## 2014-12-03 DIAGNOSIS — Z3202 Encounter for pregnancy test, result negative: Secondary | ICD-10-CM | POA: Insufficient documentation

## 2014-12-03 DIAGNOSIS — R079 Chest pain, unspecified: Secondary | ICD-10-CM | POA: Diagnosis present

## 2014-12-03 DIAGNOSIS — Z79899 Other long term (current) drug therapy: Secondary | ICD-10-CM | POA: Insufficient documentation

## 2014-12-03 LAB — URINALYSIS, ROUTINE W REFLEX MICROSCOPIC
Bilirubin Urine: NEGATIVE
Glucose, UA: 250 mg/dL — AB
HGB URINE DIPSTICK: NEGATIVE
Ketones, ur: NEGATIVE mg/dL
NITRITE: NEGATIVE
PROTEIN: NEGATIVE mg/dL
Specific Gravity, Urine: 1.019 (ref 1.005–1.030)
UROBILINOGEN UA: 0.2 mg/dL (ref 0.0–1.0)
pH: 7 (ref 5.0–8.0)

## 2014-12-03 LAB — COMPREHENSIVE METABOLIC PANEL
ALT: 46 U/L — ABNORMAL HIGH (ref 0–35)
ANION GAP: 7 (ref 5–15)
AST: 31 U/L (ref 0–37)
Albumin: 3.2 g/dL — ABNORMAL LOW (ref 3.5–5.2)
Alkaline Phosphatase: 85 U/L (ref 39–117)
BUN: 8 mg/dL (ref 6–23)
CO2: 33 mmol/L — ABNORMAL HIGH (ref 19–32)
Calcium: 9.2 mg/dL (ref 8.4–10.5)
Chloride: 100 mmol/L (ref 96–112)
Creatinine, Ser: 0.45 mg/dL — ABNORMAL LOW (ref 0.50–1.10)
GFR calc Af Amer: >90 mL/min (ref >90)
GFR calc non Af Amer: >90 mL/min (ref >90)
Glucose, Bld: 183 mg/dL — ABNORMAL HIGH (ref 70–99)
POTASSIUM: 3.7 mmol/L (ref 3.5–5.1)
Sodium: 140 mmol/L (ref 135–145)
Total Bilirubin: 0.4 mg/dL (ref 0.3–1.2)
Total Protein: 6.7 g/dL (ref 6.0–8.3)

## 2014-12-03 LAB — CBC WITH DIFFERENTIAL/PLATELET
BASOS PCT: 0 % (ref 0–1)
Basophils Absolute: 0 10*3/uL (ref 0.0–0.1)
EOS PCT: 4 % (ref 0–5)
Eosinophils Absolute: 0.2 10*3/uL (ref 0.0–0.7)
HEMATOCRIT: 38.1 % (ref 36.0–46.0)
Hemoglobin: 12.1 g/dL (ref 12.0–15.0)
LYMPHS ABS: 1.8 10*3/uL (ref 0.7–4.0)
LYMPHS PCT: 31 % (ref 12–46)
MCH: 27.3 pg (ref 26.0–34.0)
MCHC: 31.8 g/dL (ref 30.0–36.0)
MCV: 86 fL (ref 78.0–100.0)
Monocytes Absolute: 0.4 10*3/uL (ref 0.1–1.0)
Monocytes Relative: 7 % (ref 3–12)
NEUTROS PCT: 58 % (ref 43–77)
Neutro Abs: 3.5 10*3/uL (ref 1.7–7.7)
Platelets: 218 10*3/uL (ref 150–400)
RBC: 4.43 MIL/uL (ref 3.87–5.11)
RDW: 13.7 % (ref 11.5–15.5)
WBC: 6 10*3/uL (ref 4.0–10.5)

## 2014-12-03 LAB — URINE MICROSCOPIC-ADD ON

## 2014-12-03 LAB — TROPONIN I: Troponin I: <0.03 ng/mL (ref <0.031)

## 2014-12-03 LAB — PREGNANCY, URINE: Preg Test, Ur: NEGATIVE

## 2014-12-03 MED ORDER — HYDROCODONE-ACETAMINOPHEN 5-325 MG PO TABS
2.0000 | ORAL_TABLET | ORAL | Status: DC | PRN
Start: 2014-12-03 — End: 2016-01-10

## 2014-12-03 MED ORDER — IBUPROFEN 800 MG PO TABS: 800.0000 mg | ORAL_TABLET | Freq: Three times a day (TID) | ORAL | Status: DC

## 2014-12-03 NOTE — Discharge Instructions (Signed)
Chest Wall Pain °Chest wall pain is pain felt in or around the chest bones and muscles. It may take up to 6 weeks to get better. It may take longer if you are active. Chest wall pain can happen on its own. Other times, things like germs, injury, coughing, or exercise can cause the pain. °HOME CARE  °· Avoid activities that make you tired or cause pain. Try not to use your chest, belly (abdominal), or side muscles. Do not use heavy weights. °· Put ice on the sore area. °¨ Put ice in a plastic bag. °¨ Place a towel between your skin and the bag. °¨ Leave the ice on for 15-20 minutes for the first 2 days. °· Only take medicine as told by your doctor. °GET HELP RIGHT AWAY IF:  °· You have more pain or are very uncomfortable. °· You have a fever. °· Your chest pain gets worse. °· You have new problems. °· You feel sick to your stomach (nauseous) or throw up (vomit). °· You start to sweat or feel lightheaded. °· You have a cough with mucus (phlegm). °· You cough up blood. °MAKE SURE YOU:  °· Understand these instructions. °· Will watch your condition. °· Will get help right away if you are not doing well or get worse. °Document Released: 04/13/2008 Document Revised: 01/18/2012 Document Reviewed: 06/22/2011 °ExitCare® Patient Information ©2015 [employer]. This information is not intended to replace advice given to you by your health care provider. Make sure you discuss any questions you have with your health care provider. ° °

## 2014-12-03 NOTE — ED Provider Notes (Signed)
CSN: [id number]     Arrival date & time 12/03/14  4098 History   First MD Initiated Contact with Patient 12/03/14 0720     Chief Complaint  Patient presents with  . Chest Pain     (Consider location/radiation/quality/duration/timing/severity/associated sxs/prior Treatment) Patient is a 19 y.o. female presenting with chest pain. The history is provided by the patient. No language interpreter was used.  Chest Pain Pain location:  L chest and R chest Pain quality: aching   Pain radiates to:  Does not radiate Pain radiates to the back: no   Pain severity:  Moderate Duration:  3 days Timing:  Constant Progression:  Worsening Chronicity:  New Context: breathing, movement and raising an arm   Relieved by:  Nothing Worsened by:  Nothing tried Ineffective treatments:  None tried Associated symptoms: no abdominal pain and no shortness of breath   Risk factors: birth control and diabetes mellitus     Past Medical History  Diagnosis Date  . Diabetes mellitus without complication     had since she was 16   Past Surgical History  Procedure Laterality Date  . Incision and drainage of peritonsillar abcess N/A 12/10/2013    Procedure: INCISION AND DRAINAGE OF PERITONSILLAR ABCESS;  Surgeon: Flo Shanks, MD;  Location: [facility] OR;  Service: ENT;  Laterality: N/A;  . Tonsillectomy Left 12/10/2013    Procedure: TONSILLECTOMY;  Surgeon: Flo Shanks, MD;  Location: [facility] OR;  Service: ENT;  Laterality: Left;  . Tonsillectomy  12/10/2013  . Mass biopsy Right 12/29/2013    Procedure: IRRIGATION AND DEBRIDEMENT RIGHT NECK   ;  Surgeon: Flo Shanks, MD;  Location: [facility] OR;  Service: ENT;  Laterality: Right;  . Tracheostomy tube placement N/A 12/31/2013    Procedure: TRACHEOSTOMY;  Surgeon: Christia Reading, MD;  Location: [facility] OR;  Service: ENT;  Laterality: N/A;   Family History  Problem Relation Age of Onset  . Diabetes Mother   . Hypertension Mother    History  Substance Use Topics  . Smoking status:  Current Every Day Smoker  . Smokeless tobacco: Never Used     Comment: Smoking 1 cig/day  . Alcohol Use: No   OB History    Gravida Para Term Preterm AB TAB SAB Ectopic Multiple Living   0 0 0 0 0 0 1     Review of Systems  Respiratory: Negative for shortness of breath.   Cardiovascular: Positive for chest pain.  Gastrointestinal: Negative for abdominal pain.  All other systems reviewed and are negative.     Allergies  Flu virus vaccine  Home Medications   Prior to Admission medications   Medication Sig Start Date End Date Taking? Authorizing Provider  glucose blood (FREESTYLE LITE) test strip Use as instructed 11/15/14   Quentin Angst, MD  glucose blood test strip Use as instructed 01/12/14   Richarda Overlie, MD  ibuprofen (ADVIL,MOTRIN) 800 MG tablet Take 1 tablet (800 mg total) by mouth every 8 (eight) hours as needed for moderate pain or cramping. 11/22/14   Fayrene Helper, PA-C  Insulin Glargine (LANTUS SOLOSTAR) 100 UNIT/ML Solostar Pen Inject 30 Units into the skin daily at 10 pm. 11/15/14   Quentin Angst, MD  insulin NPH-regular Human (NOVOLIN 70/30) (70-30) 100 UNIT/ML injection Inject 25 Units into the skin 2 (two) times daily with a meal. 11/13/14   Quentin Angst, MD  Insulin Syringe-Needle U-100 30G X 5/16" 0.5 ML MISC Test blood sugar 3 times per day  11/15/14   Quentin Angstlugbemiga E Jegede, MD  ketorolac (ACULAR) 0.5 % ophthalmic solution Place 1 drop into both eyes 4 (four) times daily. Patient not taking: Reported on 11/27/2014 06/22/14   Garlon HatchetLisa M Sanders, PA-C  Lancets (FREESTYLE) lancets Use as instructed 11/15/14   Quentin Angstlugbemiga E Jegede, MD   BP 119/66 mmHg  Pulse 89  Temp(Src) 98.3 F (36.8 C) (Oral)  Resp 15  SpO2 100%  LMP 10/15/2014 Physical Exam  Constitutional: She is oriented to person, place, and time. She appears well-developed and well-nourished.  HENT:  Head: Normocephalic.  Eyes: EOM are normal. Pupils are equal, round, and reactive to light.  Neck:  Normal range of motion.  Cardiovascular: Normal rate, regular rhythm and normal heart sounds.   Pulmonary/Chest: Effort normal and breath sounds normal.  Abdominal: Soft. She exhibits no distension.  Musculoskeletal: Normal range of motion.  Pain with lifting arms  Neurological: She is alert and oriented to person, place, and time.  Skin: Skin is warm.  Psychiatric: She has a normal mood and affect.  Nursing note and vitals reviewed.   ED Course  Procedures (including critical care time) Labs Review Labs Reviewed  COMPREHENSIVE METABOLIC PANEL - Abnormal; Notable for the following:    CO2 33 (*)    Glucose, Bld 183 (*)    Creatinine, Ser 0.45 (*)    Albumin 3.2 (*)    ALT 46 (*)    All other components within normal limits  CBC WITH DIFFERENTIAL/PLATELET  TROPONIN I  PREGNANCY, URINE  URINALYSIS, ROUTINE W REFLEX MICROSCOPIC    Imaging Review No results found.   EKG Interpretation   Date/Time:  Monday December 03 2014 07:15:40 EST Ventricular Rate:  105 PR Interval:  130 QRS Duration: 70 QT Interval:  346 QTC Calculation: 457 R Axis:   68 Text Interpretation:  Sinus tachycardia Nonspecific ST and T wave  abnormality Abnormal ECG No significant change since last tracing  Confirmed by OTTER  MD, OLGA ([id number]) on 12/03/2014 7:27:51 AM      MDM  Troponin negative,  I doubt cardiac etiology, doubt pe.   Pain seems muscular.  Pt has chronic foot pain (probably related to diabetes.   Pt given rx for ibuprofen and hydrocodone.   Pt advised to see her MD for recheck in 1 week.    Final diagnoses:  Chest wall pain    Ibuprofen Hydrocodone     Elson AreasLeslie K Sofia, PA-C 12/03/14 0920  Enid SkeensJoshua M Zavitz, MD 12/03/14 [phone]

## 2014-12-03 NOTE — ED Notes (Signed)
Pt reports sharp mid chest pains, occurs more with movement and lifting. Denies recent cough.

## 2014-12-16 ENCOUNTER — Encounter (HOSPITAL_COMMUNITY): Payer: Self-pay | Admitting: *Deleted

## 2014-12-16 ENCOUNTER — Emergency Department (HOSPITAL_COMMUNITY): Payer: Medicaid Other

## 2014-12-16 ENCOUNTER — Emergency Department (HOSPITAL_COMMUNITY)
Admission: EM | Admit: 2014-12-16 | Discharge: 2014-12-16 | Discharge status: Against Medical Advice | Payer: Medicaid Other | Attending: Emergency Medicine | Admitting: Emergency Medicine

## 2014-12-16 DIAGNOSIS — Z79899 Other long term (current) drug therapy: Secondary | ICD-10-CM | POA: Insufficient documentation

## 2014-12-16 DIAGNOSIS — M545 Low back pain, unspecified: Secondary | ICD-10-CM

## 2014-12-16 DIAGNOSIS — Z794 Long term (current) use of insulin: Secondary | ICD-10-CM | POA: Diagnosis not present

## 2014-12-16 DIAGNOSIS — E119 Type 2 diabetes mellitus without complications: Secondary | ICD-10-CM | POA: Diagnosis not present

## 2014-12-16 DIAGNOSIS — Y998 Other external cause status: Secondary | ICD-10-CM | POA: Insufficient documentation

## 2014-12-16 DIAGNOSIS — Z720 Tobacco use: Secondary | ICD-10-CM | POA: Insufficient documentation

## 2014-12-16 DIAGNOSIS — Y9289 Other specified places as the place of occurrence of the external cause: Secondary | ICD-10-CM | POA: Diagnosis not present

## 2014-12-16 DIAGNOSIS — Y9389 Activity, other specified: Secondary | ICD-10-CM | POA: Insufficient documentation

## 2014-12-16 DIAGNOSIS — S3992XA Unspecified injury of lower back, initial encounter: Secondary | ICD-10-CM | POA: Diagnosis present

## 2014-12-16 LAB — CBG MONITORING, ED: Glucose-Capillary: 114 mg/dL — ABNORMAL HIGH (ref 70–99)

## 2014-12-16 MED ORDER — HYDROMORPHONE HCL 2 MG/ML IJ SOLN: 2.0000 mg | Freq: Once | INTRAMUSCULAR | Status: DC

## 2014-12-16 NOTE — ED Notes (Signed)
Pt refuses additional treatment but allow staff to check vital signs prior to leaving. Pt made aware of risks leaving AMA.
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
## 2014-12-16 NOTE — ED Notes (Signed)
Phlebotomy at bedside attempting blood draw. Pt refuses draw. Upon entering room pt phlebotomy report pt refuses all treatment. PA notified.

## 2015-02-04 ENCOUNTER — Ambulatory Visit: Payer: Medicaid Other | Admitting: *Deleted

## 2015-02-04 ENCOUNTER — Ambulatory Visit: Payer: Medicaid Other | Attending: Internal Medicine | Admitting: *Deleted

## 2015-02-04 DIAGNOSIS — E1065 Type 1 diabetes mellitus with hyperglycemia: Secondary | ICD-10-CM | POA: Diagnosis not present

## 2015-02-04 NOTE — Progress Notes (Signed)
Patient presents in no apparent distress for CBG and record review Med list reviewed; patient reports taking all meds as directed, except taking 30 units 70/30 insulin bid instead of 25 as listed on med list Patient's AM fasting blood sugars ranging  156-239 over last 7 days Patient's before dinner blood sugars ranging 109-320 over last 7 days  Patient reports today's AM fasting CBG 156   Patient to record BS AM fasting and before lunch and before dinner and to bring to all future appts  Patient to schedule first available appt with PCP for 3 month f/u

## 2015-02-06 ENCOUNTER — Encounter: Payer: Self-pay | Admitting: Obstetrics & Gynecology

## 2015-02-06 ENCOUNTER — Ambulatory Visit (INDEPENDENT_AMBULATORY_CARE_PROVIDER_SITE_OTHER): Payer: Medicaid Other | Admitting: Obstetrics & Gynecology

## 2015-02-06 VITALS — BP 122/68 | HR 75 | Resp 16 | Ht 66.0 in | Wt 181.0 lb

## 2015-02-06 DIAGNOSIS — Z30432 Encounter for removal of intrauterine contraceptive device: Secondary | ICD-10-CM | POA: Diagnosis not present

## 2015-02-06 DIAGNOSIS — Z319 Encounter for procreative management, unspecified: Secondary | ICD-10-CM | POA: Diagnosis not present

## 2015-02-06 NOTE — Progress Notes (Signed)
   Subjective:    Patient ID: [name] White, female    DOB: [birth year], 19 y.o.   MRN: [id number]  HPI  19 yo S AAP1 wants to have a baby. She would like IUD removed.  Review of Systems     Objective:   Physical Exam  I easily removed her Mirena easily and noted taht it was intact.      Assessment & Plan:  Desire for pregancy Rec MVI daily

## 2015-02-19 ENCOUNTER — Ambulatory Visit: Payer: Medicaid Other | Attending: Internal Medicine | Admitting: Internal Medicine

## 2015-02-19 ENCOUNTER — Encounter: Payer: Self-pay | Admitting: Internal Medicine

## 2015-02-19 VITALS — BP 128/85 | HR 82 | Temp 97.7°F | Resp 16 | Ht 66.0 in | Wt 183.2 lb

## 2015-02-19 DIAGNOSIS — E108 Type 1 diabetes mellitus with unspecified complications: Secondary | ICD-10-CM | POA: Diagnosis not present

## 2015-02-19 LAB — GLUCOSE, POCT (MANUAL RESULT ENTRY): POC Glucose: 203 mg/dl — AB (ref 70–99)

## 2015-02-19 LAB — POCT GLYCOSYLATED HEMOGLOBIN (HGB A1C): Hemoglobin A1C: 9

## 2015-02-19 MED ORDER — INSULIN ASPART PROT & ASPART (70-30 MIX) 100 UNIT/ML PEN: 25.0000 [IU] | PEN_INJECTOR | Freq: Two times a day (BID) | SUBCUTANEOUS | Status: DC

## 2015-02-19 NOTE — Progress Notes (Signed)
Patient ID: [name] White, female   DOB: [birth year], 19 y.o.   MRN: [id number]   [name] White, is a 19 y.o. female  [id number]  [id number]  DOB - 08/10/1996  Chief Complaint  Patient presents with  . Follow-up    for diabetes        Subjective:   [name] White is a 19 y.o. female here today for a follow up visit. Patient wants to switch her daytime insulin to a pen instead of a vial and needles. She denies any blurry vision, headaches, chest pain, excessive thirst, or urinary issues. She states she does have some cramps in her feet during the day and night that are painful. No abdominal pain - No Nausea, No new weakness tingling or numbness, No Cough - SOB.  No problems updated.  ALLERGIES: Allergies  Allergen Reactions  . Flu Virus Vaccine Hives    PAST MEDICAL HISTORY: Past Medical History  Diagnosis Date  . Diabetes mellitus without complication     had since she was 71    MEDICATIONS AT HOME: Prior to Admission medications   Medication Sig Start Date End Date Taking? Authorizing Provider  glucose blood (FREESTYLE LITE) test strip Use as instructed 11/15/14  Yes Scottlynn Lindell E Hyman Hopes, MD  glucose blood test strip Use as instructed 01/12/14  Yes Richarda Overlie, MD  HYDROcodone-acetaminophen (NORCO/VICODIN) 5-325 MG per tablet Take 2 tablets by mouth every 4 (four) hours as needed. 12/03/14  Yes Lonia Skinner Sofia, PA-C  ibuprofen (ADVIL,MOTRIN) 800 MG tablet Take 1 tablet (800 mg total) by mouth 3 (three) times daily. 12/03/14  Yes Lonia Skinner Sofia, PA-C  Insulin Glargine (LANTUS SOLOSTAR) 100 UNIT/ML Solostar Pen Inject 30 Units into the skin daily at 10 pm. 11/15/14  Yes Quentin Angst, MD  Insulin Syringe-Needle U-100 30G X 5/16" 0.5 ML MISC Test blood sugar 3 times per day 11/15/14  Yes Quentin Angst, MD  ketorolac (ACULAR) 0.5 % ophthalmic solution Place 1 drop into both eyes 4 (four) times daily. 06/22/14  Yes Garlon Hatchet, PA-C  Lancets (FREESTYLE)  lancets Use as instructed 11/15/14  Yes Tarah Buboltz Annitta Needs, MD  insulin aspart protamine - aspart (NOVOLOG 70/30 MIX) (70-30) 100 UNIT/ML FlexPen Inject 0.25 mLs (25 Units total) into the skin 2 (two) times daily. 02/19/15   Quentin Angst, MD     Objective:   Filed Vitals:   02/19/15 0956  BP: 128/85  Pulse: 82  Temp: 97.7 F (36.5 C)  TempSrc: Oral  Resp: 16  Height:  (1.676 m)  Weight: 183 lb 3.2 oz (83.099 kg)  SpO2: 100%    Exam General appearance : Awake, alert, not in any distress. Speech Clear. Not toxic looking HEENT: Atraumatic and Normocephalic, pupils equally reactive to light and accomodation Neck: supple, no JVD. No cervical lymphadenopathy.  Chest:Good air entry bilaterally, no added sounds  CVS: S1 S2 regular, no murmurs.  Abdomen: Bowel sounds present, Non tender and not distended with no gaurding, rigidity or rebound. Extremities: B/L Lower Ext shows no edema, both legs are warm to touch Neurology: Awake alert, and oriented X 3, CN II-XII intact, Non focal Skin:No Rash  Data Review Lab Results  Component Value Date   HGBA1C 9.0 02/19/2015   HGBA1C >15.0 11/15/2014   HGBA1C 14.0 12/21/2013     Assessment & Plan   1. Type 1 diabetes mellitus with complications  - HgB A1c history down to 9% from greater than 15%  - Glucose (CBG) -  insulin aspart protamine - aspart (NOVOLOG 70/30 MIX) (70-30) 100 UNIT/ML FlexPen; Inject 0.25 mLs (25 Units total) into the skin 2 (two) times daily.  Dispense: 5 pen; Refill: 11  Aim for 30 minutes of exercise most days. Rethink what you drink. Water is great! Aim for 2-3 Carb Choices per meal (30-45 grams) +/- 1 either way  Aim for 0-15 Carbs per snack if hungry  Include protein in moderation with your meals and snacks  Consider reading food labels for Total Carbohydrate and Fat Grams of foods  Consider checking BG at alternate times per day  Continue taking medication as directed Be mindful about how much  sugar you are adding to beverages and other foods. Fruit Punch - find one with no sugar  Measure and decrease portions of carbohydrate foods  Make your plate and don't go back for seconds  Patient have been counseled extensively about nutrition and exercise  Return in about 3 months (around 05/21/2015) for Hemoglobin A1C and Follow up, DM.  The patient was given clear instructions to go to ER or return to medical center if symptoms don't improve, worsen or new problems develop. The patient verbalized understanding. The patient was told to call to get lab results if they haven't heard anything in the next week.   This note has been created with Education officer, environmentalDragon speech recognition software and smart phrase technology. Any transcriptional errors are unintentional.    Jeanann LewandowskyJEGEDE, Americus Perkey, MD, MHA, CPE, FACP, FAAP [facility] [place], [place] [phone]   02/19/2015, 10:24 AM

## 2015-02-19 NOTE — Patient Instructions (Signed)
Basic Carbohydrate Counting for Diabetes Mellitus Carbohydrate counting is a method for keeping track of the amount of carbohydrates you eat. Eating carbohydrates naturally increases the level of sugar (glucose) in your blood, so it is important for you to know the amount that is okay for you to have in every meal. Carbohydrate counting helps keep the level of glucose in your blood within normal limits. The amount of carbohydrates allowed is different for every person. A dietitian can help you calculate the amount that is right for you. Once you know the amount of carbohydrates you can have, you can count the carbohydrates in the foods you want to eat. Carbohydrates are found in the following foods:  Grains, such as breads and cereals.  Dried beans and soy products.  Starchy vegetables, such as potatoes, peas, and corn.  Fruit and fruit juices.  Milk and yogurt.  Sweets and snack foods, such as cake, cookies, candy, chips, soft drinks, and fruit drinks. CARBOHYDRATE COUNTING There are two ways to count the carbohydrates in your food. You can use either of the methods or a combination of both. Reading the "Nutrition Facts" on Packaged Food The "Nutrition Facts" is an area that is included on the labels of almost all packaged food and beverages in the [place]. It includes the serving size of that food or beverage and information about the nutrients in each serving of the food, including the grams (g) of carbohydrate per serving.  Decide the number of servings of this food or beverage that you will be able to eat or drink. Multiply that number of servings by the number of grams of carbohydrate that is listed on the label for that serving. The total will be the amount of carbohydrates you will be having when you eat or drink this food or beverage. Learning Standard Serving Sizes of Food When you eat food that is not packaged or does not include "Nutrition Facts" on the label, you need to  measure the servings in order to count the amount of carbohydrates.A serving of most carbohydrate-rich foods contains about 15 g of carbohydrates. The following list includes serving sizes of carbohydrate-rich foods that provide 15 g ofcarbohydrate per serving:   1 slice of bread (1 oz) or 1 six-inch tortilla.    of a hamburger bun or English muffin.  4-6 crackers.   cup unsweetened dry cereal.    cup hot cereal.   cup rice or pasta.    cup mashed potatoes or  of a large baked potato.  1 cup fresh fruit or one small piece of fruit.    cup canned or frozen fruit or fruit juice.  1 cup milk.   cup plain fat-free yogurt or yogurt sweetened with artificial sweeteners.   cup cooked dried beans or starchy vegetable, such as peas, corn, or potatoes.  Decide the number of standard-size servings that you will eat. Multiply that number of servings by 15 (the grams of carbohydrates in that serving). For example, if you eat 2 cups of strawberries, you will have eaten 2 servings and 30 g of carbohydrates (2 servings x 15 g = 30 g). For foods such as soups and casseroles, in which more than one food is mixed in, you will need to count the carbohydrates in each food that is included. EXAMPLE OF CARBOHYDRATE COUNTING Sample Dinner  3 oz chicken breast.   cup of brown rice.   cup of corn.  1 cup milk.   1 cup strawberries with   sugar-free whipped topping.  Carbohydrate Calculation Step 1: Identify the foods that contain carbohydrates:   Rice.   Corn.   Milk.   Strawberries. Step 2:Calculate the number of servings eaten of each:   2 servings of rice.   1 serving of corn.   1 serving of milk.   1 serving of strawberries. Step 3: Multiply each of those number of servings by 15 g:   2 servings of rice x 15 g = 30 g.   1 serving of corn x 15 g = 15 g.   1 serving of milk x 15 g = 15 g.   1 serving of strawberries x 15 g = 15 g. Step 4: Add  together all of the amounts to find the total grams of carbohydrates eaten: 30 g + 15 g + 15 g + 15 g = 75 g. Document Released: 10/26/2005 Document Revised: 03/12/2014 Document Reviewed: 09/22/2013 ExitCare Patient Information 2015 [employer]. This information is not intended to replace advice given to you by your health care provider. Make sure you discuss any questions you have with your health care provider. Diabetes and Exercise Exercising regularly is important. It is not just about losing weight. It has many health benefits, such as:  Improving your overall fitness, flexibility, and endurance.  Increasing your bone density.  Helping with weight control.  Decreasing your body fat.  Increasing your muscle strength.  Reducing stress and tension.  Improving your overall health. People with diabetes who exercise gain additional benefits because exercise:  Reduces appetite.  Improves the body's use of blood sugar (glucose).  Helps lower or control blood glucose.  Decreases blood pressure.  Helps control blood lipids (such as cholesterol and triglycerides).  Improves the body's use of the hormone insulin by:  Increasing the body's insulin sensitivity.  Reducing the body's insulin needs.  Decreases the risk for heart disease because exercising:  Lowers cholesterol and triglycerides levels.  Increases the levels of good cholesterol (such as high-density lipoproteins [HDL]) in the body.  Lowers blood glucose levels. YOUR ACTIVITY PLAN  Choose an activity that you enjoy and set realistic goals. Your health care provider or diabetes educator can help you make an activity plan that works for you. Exercise regularly as directed by your health care provider. This includes:  Performing resistance training twice a week such as push-ups, sit-ups, lifting weights, or using resistance bands.  Performing 150 minutes of cardio exercises each week such as walking, running, or  playing sports.  Staying active and spending no more than 90 minutes at one time being inactive. Even short bursts of exercise are good for you. Three 10-minute sessions spread throughout the day are just as beneficial as a single 30-minute session. Some exercise ideas include:  Taking the dog for a walk.  Taking the stairs instead of the elevator.  Dancing to your favorite song.  Doing an exercise video.  Doing your favorite exercise with a friend. RECOMMENDATIONS FOR EXERCISING WITH TYPE 1 OR TYPE 2 DIABETES   Check your blood glucose before exercising. If blood glucose levels are greater than 240 mg/dL, check for urine ketones. Do not exercise if ketones are present.  Avoid injecting insulin into areas of the body that are going to be exercised. For example, avoid injecting insulin into:  The arms when playing tennis.  The legs when jogging.  Keep a record of:  Food intake before and after you exercise.  Expected peak times of insulin action.  Blood   glucose levels before and after you exercise.  The type and amount of exercise you have done.  Review your records with your health care provider. Your health care provider will help you to develop guidelines for adjusting food intake and insulin amounts before and after exercising.  If you take insulin or oral hypoglycemic agents, watch for signs and symptoms of hypoglycemia. They include:  Dizziness.  Shaking.  Sweating.  Chills.  Confusion.  Drink plenty of water while you exercise to prevent dehydration or heat stroke. Body water is lost during exercise and must be replaced.  Talk to your health care provider before starting an exercise program to make sure it is safe for you. Remember, almost any type of activity is better than none. Document Released: 01/16/2004 Document Revised: 03/12/2014 Document Reviewed: 04/04/2013 ExitCare Patient Information 2015 [employer]. This information is not intended to  replace advice given to you by your health care provider. Make sure you discuss any questions you have with your health care provider.  

## 2015-02-19 NOTE — Progress Notes (Signed)
Patient wants to switch her daytime insulin to a pen instead of a vial and needles.  She denies any blurry vision, headaches, chest pain, excessive thirst, or urinary issues.  She states she does have some cramps in her feet during the day and night that are painful.

## 2015-04-24 ENCOUNTER — Other Ambulatory Visit: Payer: Self-pay | Admitting: Pharmacist

## 2015-04-24 DIAGNOSIS — E1010 Type 1 diabetes mellitus with ketoacidosis without coma: Secondary | ICD-10-CM

## 2015-04-24 MED ORDER — INSULIN PEN NEEDLE 32G X 4 MM MISC: Status: AC

## 2015-05-10 ENCOUNTER — Other Ambulatory Visit: Payer: Self-pay

## 2015-05-10 ENCOUNTER — Telehealth: Payer: Self-pay | Admitting: Internal Medicine

## 2015-05-10 DIAGNOSIS — E109 Type 1 diabetes mellitus without complications: Secondary | ICD-10-CM

## 2015-05-10 MED ORDER — INSULIN GLARGINE 100 UNIT/ML SOLOSTAR PEN: 30.0000 [IU] | PEN_INJECTOR | Freq: Every day | SUBCUTANEOUS | Status: DC

## 2015-05-10 NOTE — Telephone Encounter (Signed)
Patient came into office requesting medication refill on Insulin Glargine (LANTUS SOLOSTAR) 100 UNIT/ML Solostar Pen. Patient is out in the lobby

## 2015-08-18 ENCOUNTER — Emergency Department (HOSPITAL_COMMUNITY): Payer: Medicaid Other

## 2015-08-18 ENCOUNTER — Emergency Department (HOSPITAL_COMMUNITY)
Admission: EM | Admit: 2015-08-18 | Discharge: 2015-08-18 | Disposition: A | Discharge status: Home / Self Care | Payer: Medicaid Other | Attending: Emergency Medicine | Admitting: Emergency Medicine

## 2015-08-18 ENCOUNTER — Encounter (HOSPITAL_COMMUNITY): Payer: Self-pay | Admitting: Emergency Medicine

## 2015-08-18 DIAGNOSIS — E119 Type 2 diabetes mellitus without complications: Secondary | ICD-10-CM | POA: Diagnosis not present

## 2015-08-18 DIAGNOSIS — S80211A Abrasion, right knee, initial encounter: Secondary | ICD-10-CM | POA: Diagnosis not present

## 2015-08-18 DIAGNOSIS — S50311A Abrasion of right elbow, initial encounter: Secondary | ICD-10-CM | POA: Insufficient documentation

## 2015-08-18 DIAGNOSIS — Z720 Tobacco use: Secondary | ICD-10-CM | POA: Diagnosis not present

## 2015-08-18 DIAGNOSIS — T07XXXA Unspecified multiple injuries, initial encounter: Secondary | ICD-10-CM

## 2015-08-18 DIAGNOSIS — S40811A Abrasion of right upper arm, initial encounter: Secondary | ICD-10-CM | POA: Diagnosis not present

## 2015-08-18 DIAGNOSIS — S0083XA Contusion of other part of head, initial encounter: Secondary | ICD-10-CM | POA: Insufficient documentation

## 2015-08-18 DIAGNOSIS — Y9289 Other specified places as the place of occurrence of the external cause: Secondary | ICD-10-CM | POA: Insufficient documentation

## 2015-08-18 DIAGNOSIS — W0110XA Fall on same level from slipping, tripping and stumbling with subsequent striking against unspecified object, initial encounter: Secondary | ICD-10-CM | POA: Diagnosis not present

## 2015-08-18 DIAGNOSIS — Z794 Long term (current) use of insulin: Secondary | ICD-10-CM | POA: Diagnosis not present

## 2015-08-18 DIAGNOSIS — Y9389 Activity, other specified: Secondary | ICD-10-CM | POA: Diagnosis not present

## 2015-08-18 DIAGNOSIS — S8001XA Contusion of right knee, initial encounter: Secondary | ICD-10-CM | POA: Insufficient documentation

## 2015-08-18 DIAGNOSIS — Z23 Encounter for immunization: Secondary | ICD-10-CM | POA: Diagnosis not present

## 2015-08-18 DIAGNOSIS — S50811A Abrasion of right forearm, initial encounter: Secondary | ICD-10-CM | POA: Diagnosis not present

## 2015-08-18 DIAGNOSIS — S8991XA Unspecified injury of right lower leg, initial encounter: Secondary | ICD-10-CM

## 2015-08-18 DIAGNOSIS — W010XXA Fall on same level from slipping, tripping and stumbling without subsequent striking against object, initial encounter: Secondary | ICD-10-CM

## 2015-08-18 DIAGNOSIS — S8011XA Contusion of right lower leg, initial encounter: Secondary | ICD-10-CM | POA: Insufficient documentation

## 2015-08-18 DIAGNOSIS — Z791 Long term (current) use of non-steroidal anti-inflammatories (NSAID): Secondary | ICD-10-CM | POA: Diagnosis not present

## 2015-08-18 DIAGNOSIS — Y998 Other external cause status: Secondary | ICD-10-CM | POA: Insufficient documentation

## 2015-08-18 DIAGNOSIS — S0990XA Unspecified injury of head, initial encounter: Secondary | ICD-10-CM | POA: Diagnosis present

## 2015-08-18 DIAGNOSIS — S40021A Contusion of right upper arm, initial encounter: Secondary | ICD-10-CM | POA: Insufficient documentation

## 2015-08-18 DIAGNOSIS — S5011XA Contusion of right forearm, initial encounter: Secondary | ICD-10-CM | POA: Diagnosis not present

## 2015-08-18 LAB — CBG MONITORING, ED: GLUCOSE-CAPILLARY: 221 mg/dL — AB (ref 65–99)

## 2015-08-18 MED ORDER — IBUPROFEN 800 MG PO TABS: 800.0000 mg | ORAL_TABLET | Freq: Three times a day (TID) | ORAL | Status: DC

## 2015-08-18 MED ORDER — BACITRACIN ZINC 500 UNIT/GM EX OINT: 1.0000 "application " | TOPICAL_OINTMENT | Freq: Two times a day (BID) | CUTANEOUS | Status: DC

## 2015-08-18 MED ORDER — HYDROCODONE-ACETAMINOPHEN 5-325 MG PO TABS
2.0000 | ORAL_TABLET | Freq: Once | ORAL | Status: AC
  Administered 2015-08-18: 2 via ORAL
  Filled 2015-08-18: qty 2

## 2015-08-18 MED ORDER — TETANUS-DIPHTH-ACELL PERTUSSIS 5-2.5-18.5 LF-MCG/0.5 IM SUSP
0.5000 mL | Freq: Once | INTRAMUSCULAR | Status: AC
  Administered 2015-08-18: 0.5 mL via INTRAMUSCULAR
  Filled 2015-08-18: qty 0.5

## 2015-08-18 NOTE — ED Notes (Signed)
Pt denies any LOC  At time of fall. Pt reports she fell down 2 steps.

## 2015-08-18 NOTE — ED Provider Notes (Signed)
CSN: [id number]     Arrival date & time 08/18/15  1626 History  By signing my name below, I, Phillis Haggis, attest that this documentation has been prepared under the direction and in the presence of [employer], PA-C. Electronically Signed: Phillis Haggis, ED Scribe. 08/18/2015. 7:12 PM.  Chief Complaint  Patient presents with  . Fall  . Head Injury  . Abrasion   The history is provided by the patient. No language interpreter was used.  HPI Comments: [name] White is a 19 y.o. female with a hx of DM who presents to the Emergency Department complaining of a slip and fall onset 8 hours ago. Pt states that she fell on two wet brick steps around 9 AM, landed on her right side, and hit her head, sustaining a bump to the left side of her face. Pt states that she was wearing shoes with no grip on the bottom and was not able to put her hands down. She states that she was able to ambulate after the fall without difficulty. She reports associated gradually worsening, throbbing headache, abrasion to right upper arm and elbow, and pain to bilateral knees that radiate to her feet that worsens with sitting for long periods of time. She denies vision changes, nausea, vomiting, dizziness, lightheadedness.  She states that she takes insulin for her diabetes, which she was diagnosed with when she was 52. She states that she used disinfectant on the abrasions but has not taken anything for pain. She denies knowing when her last tdap was. She denies LOC, neck pain, back pain, numbness, weakness, visual disturbances, or use of blood thinners. LMP was 08/12/15.   Past Medical History  Diagnosis Date  . Diabetes mellitus without complication (HCC)     had since she was 16   Past Surgical History  Procedure Laterality Date  . Incision and drainage of peritonsillar abcess N/A 12/10/2013    Procedure: INCISION AND DRAINAGE OF PERITONSILLAR ABCESS;  Surgeon: Flo Shanks, MD;  Location: [facility] OR;  Service: ENT;   Laterality: N/A;  . Tonsillectomy Left 12/10/2013    Procedure: TONSILLECTOMY;  Surgeon: Flo Shanks, MD;  Location: [facility] OR;  Service: ENT;  Laterality: Left;  . Tonsillectomy  12/10/2013  . Mass biopsy Right 12/29/2013    Procedure: IRRIGATION AND DEBRIDEMENT RIGHT NECK   ;  Surgeon: Flo Shanks, MD;  Location: [facility] OR;  Service: ENT;  Laterality: Right;  . Tracheostomy tube placement N/A 12/31/2013    Procedure: TRACHEOSTOMY;  Surgeon: Christia Reading, MD;  Location: [facility] OR;  Service: ENT;  Laterality: N/A;   Family History  Problem Relation Age of Onset  . Diabetes Mother   . Hypertension Mother    Social History  Substance Use Topics  . Smoking status: Current Every Day Smoker    Types: Cigars  . Smokeless tobacco: Never Used     Comment: Smoking 1 cigar every day  . Alcohol Use: No   OB History    Gravida Para Term Preterm AB TAB SAB Ectopic Multiple Living   0 0 0 0 0 0 1     Review of Systems  Constitutional: Negative for fever and chills.  HENT: Negative for dental problem, facial swelling and nosebleeds.   Eyes: Negative for visual disturbance.  Respiratory: Negative for cough, chest tightness, shortness of breath, wheezing and stridor.   Cardiovascular: Negative for chest pain.  Gastrointestinal: Negative for nausea, vomiting and abdominal pain.  Genitourinary: Negative for dysuria, hematuria and flank  pain.  Musculoskeletal: Positive for arthralgias. Negative for back pain, joint swelling, gait problem, neck pain and neck stiffness.  Skin: Positive for color change and wound. Negative for rash.  Neurological: Positive for headaches. Negative for syncope, weakness, light-headedness and numbness.  Hematological: Does not bruise/bleed easily.  Psychiatric/Behavioral: The patient is not nervous/anxious.   All other systems reviewed and are negative.  Allergies  Flu virus vaccine  Home Medications   Prior to Admission medications   Medication Sig Start Date End Date  Taking? Authorizing Provider  bacitracin ointment Apply 1 application topically 2 (two) times daily. 08/18/15   Mizuki Hoel, PA-C  glucose blood (FREESTYLE LITE) test strip Use as instructed 11/15/14   Quentin Angst, MD  glucose blood test strip Use as instructed 01/12/14   Richarda Overlie, MD  HYDROcodone-acetaminophen (NORCO/VICODIN) 5-325 MG per tablet Take 2 tablets by mouth every 4 (four) hours as needed. 12/03/14   Elson Areas, PA-C  ibuprofen (ADVIL,MOTRIN) 800 MG tablet Take 1 tablet (800 mg total) by mouth 3 (three) times daily. 08/18/15   Avabella Wailes, PA-C  insulin aspart protamine - aspart (NOVOLOG 70/30 MIX) (70-30) 100 UNIT/ML FlexPen Inject 0.25 mLs (25 Units total) into the skin 2 (two) times daily. 02/19/15   Quentin Angst, MD  Insulin Glargine (LANTUS SOLOSTAR) 100 UNIT/ML Solostar Pen Inject 30 Units into the skin daily at 10 pm. 05/10/15   Doris Cheadle, MD  Insulin Pen Needle (ULTICARE MICRO PEN NEEDLES) 32G X 4 MM MISC Use as directed 04/24/15   Quentin Angst, MD  ketorolac (ACULAR) 0.5 % ophthalmic solution Place 1 drop into both eyes 4 (four) times daily. 06/22/14   Garlon Hatchet, PA-C  Lancets (FREESTYLE) lancets Use as instructed 11/15/14   Quentin Angst, MD   BP 128/82 mmHg  Pulse 82  Temp(Src) 97.9 F (36.6 C) (Oral)  Resp 17  Ht 5\' 7"  (1.702 m)  Wt 197 lb 6.4 oz (89.54 kg)  BMI 30.91 kg/m2  SpO2 100%  LMP 08/12/2015 (Exact Date) Physical Exam  Constitutional: She is oriented to person, place, and time. She appears well-developed and well-nourished. No distress.  HENT:  Head: Normocephalic.  Right Ear: Tympanic membrane, external ear and ear canal normal.  Left Ear: Tympanic membrane, external ear and ear canal normal.  Nose: Nose normal. No epistaxis. Right sinus exhibits no maxillary sinus tenderness and no frontal sinus tenderness. Left sinus exhibits no maxillary sinus tenderness and no frontal sinus tenderness.  Mouth/Throat:  Uvula is midline, oropharynx is clear and moist and mucous membranes are normal. Mucous membranes are not pale and not cyanotic. No oropharyngeal exudate, posterior oropharyngeal edema, posterior oropharyngeal erythema or tonsillar abscesses.  Small contusion to the left zygomatic arch No laceration No TTP around the left orbit  Eyes: Conjunctivae and EOM are normal. Pupils are equal, round, and reactive to light. No scleral icterus.  No horizontal, vertical or rotational nystagmus Full EOMs without pain or diplopia No conjunctival hemorrhage  Neck: Normal range of motion and full passive range of motion without pain. Neck supple.  Full active and passive ROM without pain No midline or paraspinal tenderness No nuchal rigidity or meningeal signs  Cardiovascular: Normal rate, regular rhythm and intact distal pulses.   Pulmonary/Chest: Effort normal and breath sounds normal. No stridor. No respiratory distress. She has no wheezes. She has no rales.  Clear and equal breath sounds without focal wheezes, rhonchi, rales No contusions, flail segment Equal chest rise  Abdominal:  Soft. Bowel sounds are normal. There is no tenderness. There is no rebound and no guarding.  No contusions Soft and nontender  Musculoskeletal: Normal range of motion.  Small, tender contusion to anterior and lateral mid right lower leg; contusion and abrasion to anterior right knee; large contusion and abrasion to the lateral proximal right upper arm and proximal medial forearm just distal to the elbow;  FROM of the right shoulder, elbow and wrist TTP of the lateral and medial joint line of the right knee; patellar tendon intact; small joint effusion noted; full extension of right knee and flexion up to 90 degrees with pain.  FROM of the T-spine and L spine No midline or paraspinal tenderness; no obvious contusions to the flank  Lymphadenopathy:    She has no cervical adenopathy.  Neurological: She is alert and oriented  to person, place, and time. She has normal reflexes. No cranial nerve deficit. She exhibits normal muscle tone. Coordination normal.  Mental Status:  Alert, oriented, thought content appropriate. Speech fluent without evidence of aphasia. Able to follow 2 step commands without difficulty.  Cranial Nerves:  II:  Peripheral visual fields grossly normal, pupils equal, round, reactive to light III,IV, VI: ptosis not present, extra-ocular motions intact bilaterally  V,VII: smile symmetric, facial light touch sensation equal VIII: hearing grossly normal bilaterally  IX,X: midline uvula rise  XI: bilateral shoulder shrug equal and strong XII: midline tongue extension  Motor:  5/5 in upper and lower extremities bilaterally including strong and equal grip strength and dorsiflexion/plantar flexion Sensory: Pinprick and light touch normal in all extremities.  Deep Tendon Reflexes: 2+ and symmetric  Cerebellar: normal finger-to-nose with bilateral upper extremities Gait: antalgic gait favoring the right leg but able to weight bear, no foot drop; normal balance CV: distal pulses palpable throughout   Skin: Skin is warm and dry. No rash noted. She is not diaphoretic.  Psychiatric: She has a normal mood and affect. Her behavior is normal. Judgment and thought content normal.  Nursing note and vitals reviewed.   ED Course  Procedures (including critical care time) DIAGNOSTIC STUDIES: Oxygen Saturation is 100% on RA, normal by my interpretation.    COORDINATION OF CARE: 5:35 PM-Discussed treatment plan which includes x-rays, pain medication, tdap, and wound care with pt at bedside and pt agreed to plan.   Labs Review Labs Reviewed  CBG MONITORING, ED - Abnormal; Notable for the following:    Glucose-Capillary 221 (*)    All other components within normal limits    Imaging Review Dg Elbow Complete Right  08/18/2015   CLINICAL DATA:  Right arm and elbow pain status post fall this morning.  EXAM:  RIGHT HUMERUS - 2+ VIEW; RIGHT ELBOW - COMPLETE 3+ VIEW  COMPARISON:  None.  FINDINGS: No evidence of acute fracture or subluxation. No focal bone lesion or bone destruction. Bone cortex and trabecular architecture appear intact. No evidence of joint effusion.  IMPRESSION: No acute fracture or dislocation identified about the right humerus and elbow.   Electronically Signed   By: Ted Mcalpine M.D.   On: 08/18/2015 18:37   Dg Knee Complete 4 Views Right  08/18/2015   CLINICAL DATA:  Pain status post fall.  EXAM: RIGHT KNEE - COMPLETE 4+ VIEW  COMPARISON:  None.  FINDINGS: No evidence of acute fracture or subluxation. No focal bone lesion or bone destruction. Bone cortex and trabecular architecture appear intact. No evidence of soft tissue swelling.  IMPRESSION: No acute fracture or dislocation identified  about the right knee.   Electronically Signed   By: Ted Mcalpine M.D.   On: 08/18/2015 18:38   Dg Humerus Right  08/18/2015   CLINICAL DATA:  Right arm and elbow pain status post fall this morning.  EXAM: RIGHT HUMERUS - 2+ VIEW; RIGHT ELBOW - COMPLETE 3+ VIEW  COMPARISON:  None.  FINDINGS: No evidence of acute fracture or subluxation. No focal bone lesion or bone destruction. Bone cortex and trabecular architecture appear intact. No evidence of joint effusion.  IMPRESSION: No acute fracture or dislocation identified about the right humerus and elbow.   Electronically Signed   By: Ted Mcalpine M.D.   On: 08/18/2015 18:37   I have personally reviewed and evaluated these images and lab results as part of my medical decision-making.   EKG Interpretation None      MDM   Final diagnoses:  Fall from slip, trip, or stumble, initial encounter  Abrasions of multiple sites  Arm contusion, right, initial encounter  Right knee injury, initial encounter   Makaelyn Aponte presents to the ED after fall with contusions and abrasions.  Pt reports hitting her head with mild headache, but  normal neurologic exam 8 hours post fall.  Significant other denies AMS throughout the day.  No visual changes, nausea or vomiting; doubt ICH.  Pt with tenderness to the large contusions.  Will clean abrasions and update Tdap.  X-rays pending.  Vicodin for pain.    6:57 PM Patient with resolution of headache.  She ambulates here in the emergency department with antalgic gait, but no neurologic deficits.  X-rays are without acute abnormality.  No fractures or dislocations. Blood glucose elevated to 221 however patient reports she just recently ate.  She reports she has been compliant with her medications.  Wounds cleaned and bacitracin applied.  Pt advised to follow up with PCP for further evaluation.  Patient given knee sleeve while in ED, conservative therapy recommended and discussed. Patient will be dc home & is agreeable with above plan.  BP 128/82 mmHg  Pulse 82  Temp(Src) 97.9 F (36.6 C) (Oral)  Resp 17  Ht 5\' 7"  (1.702 m)  Wt 197 lb 6.4 oz (89.54 kg)  BMI 30.91 kg/m2  SpO2 100%  LMP 08/12/2015 (Exact Date)  I personally performed the services described in this documentation, which was scribed in my presence. The recorded information has been reviewed and is accurate.'  Dierdre Forth, PA-C 08/18/15 1913  Laurence Spates, MD 08/18/15 1950

## 2015-08-18 NOTE — ED Notes (Signed)
Pt c/o slip and fall on wet steps about 0900 today. Pt reports hitting her head but did not lose conscious. Pt c/o headache and has an abrasion to her right upper arm and elbow. Pt also c/o pain to B/L feet.

## 2015-08-18 NOTE — Discharge Instructions (Signed)
1. Medications: alternate naprosyn and tylenol for pain control, usual home medications 2. Treatment: rest, ice, elevate and use brace, drink plenty of fluids, gentle stretching, keep wounds clean with warm soap and water, use bacitracin on the wounds 3. Follow Up: Please followup with orthopedics as directed or your PCP in 1 week if no improvement for discussion of your diagnoses and further evaluation after today's visit; if you do not have a primary care doctor use the resource guide provided to find one; Please return to the ER for worsening symptoms or other concerns   Cryotherapy Cryotherapy means treatment with cold. Ice or gel packs can be used to reduce both pain and swelling. Ice is the most helpful within the first 24 to 48 hours after an injury or flare-up from overusing a muscle or joint. Sprains, strains, spasms, burning pain, shooting pain, and aches can all be eased with ice. Ice can also be used when recovering from surgery. Ice is effective, has very few side effects, and is safe for most people to use. PRECAUTIONS  Ice is not a safe treatment option for people with:  Raynaud phenomenon. This is a condition affecting small blood vessels in the extremities. Exposure to cold may cause your problems to return.  Cold hypersensitivity. There are many forms of cold hypersensitivity, including:  Cold urticaria. Red, itchy hives appear on the skin when the tissues begin to warm after being iced.  Cold erythema. This is a red, itchy rash caused by exposure to cold.  Cold hemoglobinuria. Red blood cells break down when the tissues begin to warm after being iced. The hemoglobin that carry oxygen are passed into the urine because they cannot combine with blood proteins fast enough.  Numbness or altered sensitivity in the area being iced. If you have any of the following conditions, do not use ice until you have discussed cryotherapy with your caregiver:  Heart conditions, such as  arrhythmia, angina, or chronic heart disease.  High blood pressure.  Healing wounds or open skin in the area being iced.  Current infections.  Rheumatoid arthritis.  Poor circulation.  Diabetes. Ice slows the blood flow in the region it is applied. This is beneficial when trying to stop inflamed tissues from spreading irritating chemicals to surrounding tissues. However, if you expose your skin to cold temperatures for too long or without the proper protection, you can damage your skin or nerves. Watch for signs of skin damage due to cold. HOME CARE INSTRUCTIONS Follow these tips to use ice and cold packs safely.  Place a dry or damp towel between the ice and skin. A damp towel will cool the skin more quickly, so you may need to shorten the time that the ice is used.  For a more rapid response, add gentle compression to the ice.  Ice for no more than 10 to 20 minutes at a time. The bonier the area you are icing, the less time it will take to get the benefits of ice.  Check your skin after 5 minutes to make sure there are no signs of a poor response to cold or skin damage.  Rest 20 minutes or more between uses.  Once your skin is numb, you can end your treatment. You can test numbness by very lightly touching your skin. The touch should be so light that you do not see the skin dimple from the pressure of your fingertip. When using ice, most people will feel these normal sensations in this order: cold,  burning, aching, and numbness.  Do not use ice on someone who cannot communicate their responses to pain, such as small children or people with dementia. HOW TO MAKE AN ICE PACK Ice packs are the most common way to use ice therapy. Other methods include ice massage, ice baths, and cryosprays. Muscle creams that cause a cold, tingly feeling do not offer the same benefits that ice offers and should not be used as a substitute unless recommended by your caregiver. To make an ice pack, do one  of the following:  Place crushed ice or a bag of frozen vegetables in a sealable plastic bag. Squeeze out the excess air. Place this bag inside another plastic bag. Slide the bag into a pillowcase or place a damp towel between your skin and the bag.  Mix 3 parts water with 1 part rubbing alcohol. Freeze the mixture in a sealable plastic bag. When you remove the mixture from the freezer, it will be slushy. Squeeze out the excess air. Place this bag inside another plastic bag. Slide the bag into a pillowcase or place a damp towel between your skin and the bag. SEEK MEDICAL CARE IF:  You develop white spots on your skin. This may give the skin a blotchy (mottled) appearance.  Your skin turns blue or pale.  Your skin becomes waxy or hard.  Your swelling gets worse. MAKE SURE YOU:   Understand these instructions.  Will watch your condition.  Will get help right away if you are not doing well or get worse.   This information is not intended to replace advice given to you by your health care provider. Make sure you discuss any questions you have with your health care provider.   Document Released: 06/22/2011 Document Revised: 11/16/2014 Document Reviewed: 06/22/2011 Elsevier Interactive Patient Education 2016 [employer].   Abrasion An abrasion is a cut or scrape on the outer surface of your skin. An abrasion does not extend through all of the layers of your skin. It is important to care for your abrasion properly to prevent infection. CAUSES Most abrasions are caused by falling on or gliding across the ground or another surface. When your skin rubs on something, the outer and inner layer of skin rubs off.  SYMPTOMS A cut or scrape is the main symptom of this condition. The scrape may be bleeding, or it may appear red or pink. If there was an associated fall, there may be an underlying bruise. DIAGNOSIS An abrasion is diagnosed with a physical exam. TREATMENT Treatment for this  condition depends on how large and deep the abrasion is. Usually, your abrasion will be cleaned with water and mild soap. This removes any dirt or debris that may be stuck. An antibiotic ointment may be applied to the abrasion to help prevent infection. A bandage (dressing) may be placed on the abrasion to keep it clean. You may also need a tetanus shot. HOME CARE INSTRUCTIONS Medicines  Take or apply medicines only as directed by your health care provider.  If you were prescribed an antibiotic ointment, finish all of it even if you start to feel better. Wound Care  Clean the wound with mild soap and water 2-3 times per day or as directed by your health care provider. Pat your wound dry with a clean towel. Do not rub it.  There are many different ways to close and cover a wound. Follow instructions from your health care provider about:  Wound care.  Dressing changes and removal.  Check your wound every day for signs of infection. Watch for:  Redness, swelling, or pain.  Fluid, blood, or pus. General Instructions  Keep the dressing dry as directed by your health care provider. Do not take baths, swim, use a hot tub, or do anything that would put your wound underwater until your health care provider approves.  If there is swelling, raise (elevate) the injured area above the level of your heart while you are sitting or lying down.  Keep all follow-up visits as directed by your health care provider. This is important. SEEK MEDICAL CARE IF:  You received a tetanus shot and you have swelling, severe pain, redness, or bleeding at the injection site.  Your pain is not controlled with medicine.  You have increased redness, swelling, or pain at the site of your wound. SEEK IMMEDIATE MEDICAL CARE IF:  You have a red streak going away from your wound.  You have a fever.  You have fluid, blood, or pus coming from your wound.  You notice a bad smell coming from your wound or your  dressing.   This information is not intended to replace advice given to you by your health care provider. Make sure you discuss any questions you have with your health care provider.   Document Released: 08/05/2005 Document Revised: 07/17/2015 Document Reviewed: 10/24/2014 Elsevier Interactive Patient Education [employer]

## 2015-11-25 MED FILL — LANTUS SOLOSTAR 100 UNITS/M: 100 | 19 days supply | Qty: 6 | Fill #4

## 2015-12-08 ENCOUNTER — Emergency Department (HOSPITAL_COMMUNITY)
Admission: EM | Admit: 2015-12-08 | Discharge: 2015-12-08 | Disposition: A | Discharge status: Home / Self Care | Payer: Medicaid Other | Attending: Emergency Medicine | Admitting: Emergency Medicine

## 2015-12-08 ENCOUNTER — Encounter (HOSPITAL_COMMUNITY): Payer: Self-pay | Admitting: Nurse Practitioner

## 2015-12-08 ENCOUNTER — Emergency Department (HOSPITAL_COMMUNITY): Payer: Medicaid Other

## 2015-12-08 DIAGNOSIS — Y998 Other external cause status: Secondary | ICD-10-CM | POA: Diagnosis not present

## 2015-12-08 DIAGNOSIS — S0083XA Contusion of other part of head, initial encounter: Secondary | ICD-10-CM | POA: Diagnosis not present

## 2015-12-08 DIAGNOSIS — T148XXA Other injury of unspecified body region, initial encounter: Secondary | ICD-10-CM

## 2015-12-08 DIAGNOSIS — S0993XA Unspecified injury of face, initial encounter: Secondary | ICD-10-CM | POA: Diagnosis present

## 2015-12-08 DIAGNOSIS — Y9389 Activity, other specified: Secondary | ICD-10-CM | POA: Diagnosis not present

## 2015-12-08 DIAGNOSIS — E119 Type 2 diabetes mellitus without complications: Secondary | ICD-10-CM | POA: Insufficient documentation

## 2015-12-08 DIAGNOSIS — Z794 Long term (current) use of insulin: Secondary | ICD-10-CM | POA: Diagnosis not present

## 2015-12-08 DIAGNOSIS — F17210 Nicotine dependence, cigarettes, uncomplicated: Secondary | ICD-10-CM | POA: Insufficient documentation

## 2015-12-08 DIAGNOSIS — Y9289 Other specified places as the place of occurrence of the external cause: Secondary | ICD-10-CM | POA: Diagnosis not present

## 2015-12-08 DIAGNOSIS — S60512A Abrasion of left hand, initial encounter: Secondary | ICD-10-CM | POA: Diagnosis not present

## 2015-12-08 NOTE — ED Notes (Signed)
Bed: WA06 Expected date: 12/08/15 Expected time: 12:35 PM Means of arrival: Ambulance Comments: Rt flank pain, fall hitting flank
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
## 2015-12-13 ENCOUNTER — Other Ambulatory Visit: Payer: Self-pay | Admitting: Internal Medicine

## 2015-12-18 ENCOUNTER — Other Ambulatory Visit: Payer: Self-pay | Admitting: Internal Medicine

## 2015-12-19 ENCOUNTER — Encounter: Payer: Self-pay | Admitting: Internal Medicine

## 2015-12-19 ENCOUNTER — Ambulatory Visit: Payer: Medicaid Other | Attending: Internal Medicine | Admitting: Internal Medicine

## 2015-12-19 VITALS — BP 115/70 | HR 82 | Temp 98.7°F | Resp 18 | Ht 66.0 in | Wt 196.6 lb

## 2015-12-19 DIAGNOSIS — Z760 Encounter for issue of repeat prescription: Secondary | ICD-10-CM | POA: Diagnosis present

## 2015-12-19 DIAGNOSIS — H269 Unspecified cataract: Secondary | ICD-10-CM | POA: Diagnosis not present

## 2015-12-19 DIAGNOSIS — E109 Type 1 diabetes mellitus without complications: Secondary | ICD-10-CM | POA: Diagnosis not present

## 2015-12-19 DIAGNOSIS — Z79899 Other long term (current) drug therapy: Secondary | ICD-10-CM | POA: Insufficient documentation

## 2015-12-19 DIAGNOSIS — E108 Type 1 diabetes mellitus with unspecified complications: Secondary | ICD-10-CM

## 2015-12-19 DIAGNOSIS — E1136 Type 2 diabetes mellitus with diabetic cataract: Secondary | ICD-10-CM | POA: Diagnosis not present

## 2015-12-19 DIAGNOSIS — Z794 Long term (current) use of insulin: Secondary | ICD-10-CM | POA: Diagnosis not present

## 2015-12-19 LAB — COMPLETE METABOLIC PANEL WITH GFR
ALT: 18 U/L (ref 6–29)
AST: 25 U/L (ref 10–30)
Albumin: 3.8 g/dL (ref 3.6–5.1)
Alkaline Phosphatase: 71 U/L (ref 33–115)
BUN: 6 mg/dL — AB (ref 7–25)
CO2: 22 mmol/L (ref 20–31)
Calcium: 8.8 mg/dL (ref 8.6–10.2)
Chloride: 105 mmol/L (ref 98–110)
Creat: 0.57 mg/dL (ref 0.50–1.10)
GFR, Est African American: >89 mL/min (ref >=60)
GLUCOSE: 91 mg/dL (ref 65–99)
POTASSIUM: 4 mmol/L (ref 3.5–5.3)
SODIUM: 139 mmol/L (ref 135–146)
Total Bilirubin: 0.2 mg/dL (ref 0.2–1.2)
Total Protein: 6.9 g/dL (ref 6.1–8.1)

## 2015-12-19 LAB — POCT GLYCOSYLATED HEMOGLOBIN (HGB A1C): Hemoglobin A1C: 7.8

## 2015-12-19 LAB — TSH: TSH: 0.38 mIU/L — ABNORMAL LOW

## 2015-12-19 LAB — GLUCOSE, POCT (MANUAL RESULT ENTRY): POC Glucose: 81 mg/dl (ref 70–99)

## 2015-12-19 MED ORDER — INSULIN GLARGINE 100 UNIT/ML SOLOSTAR PEN: PEN_INJECTOR | SUBCUTANEOUS | Status: AC

## 2015-12-19 MED ORDER — INSULIN ASPART PROT & ASPART (70-30 MIX) 100 UNIT/ML PEN: 25.0000 [IU] | PEN_INJECTOR | Freq: Two times a day (BID) | SUBCUTANEOUS | Status: AC

## 2015-12-19 NOTE — Progress Notes (Signed)
Patient is here for medication refill  Patient request a refill on both insulins.

## 2015-12-19 NOTE — Progress Notes (Signed)
Patient ID: [name] White, female   DOB: 06/04/1996, 20 y.o.   MRN: [id number]   [name] White, is a 20 y.o. female  [id number]  [id number]  DOB - [birth year]  Chief Complaint  Patient presents with  . Medication Refill    Insulin        Subjective:   [name] White is a 20 y.o. female with type 1 diabetes mellitus here today for a follow up visit. She has no new complaint today. She is doing well on current regimen, most importantly she is compliant with nutritional advice. She has bilateral contract from diabetes, has seen an ophthalmologist and she is being scheduled for surgery coming up. She denies any numbness, no shortness of breath, no chest pain. She requests refill on her medications. Patient has No headache, No abdominal pain - No Nausea, No Cough.  Problem  Cataract due to DM (HCC)    ALLERGIES: Allergies  Allergen Reactions  . Flu Virus Vaccine Hives    PAST MEDICAL HISTORY: Past Medical History  Diagnosis Date  . Diabetes mellitus without complication (HCC)     had since she was 25    MEDICATIONS AT HOME: Prior to Admission medications   Medication Sig Start Date End Date Taking? Authorizing Provider  glucose blood (FREESTYLE LITE) test strip Use as instructed 11/15/14  Yes Quentin Angst, MD  glucose blood test strip Use as instructed 01/12/14  Yes Richarda Overlie, MD  insulin aspart protamine - aspart (NOVOLOG 70/30 MIX) (70-30) 100 UNIT/ML FlexPen Inject 0.25 mLs (25 Units total) into the skin 2 (two) times daily. 12/19/15  Yes Quentin Angst, MD  Insulin Glargine (LANTUS SOLOSTAR) 100 UNIT/ML Solostar Pen Inject 30 units into skin daily at 10pm. Must have office visit for refills 12/19/15  Yes Quentin Angst, MD  Insulin Pen Needle (ULTICARE MICRO PEN NEEDLES) 32G X 4 MM MISC Use as directed 04/24/15  Yes Quentin Angst, MD  Lancets (FREESTYLE) lancets Use as instructed 11/15/14  Yes Quentin Angst, MD  bacitracin ointment  Apply 1 application topically 2 (two) times daily. Patient not taking: Reported on 12/08/2015 08/18/15   Dahlia Client Muthersbaugh, PA-C  HYDROcodone-acetaminophen (NORCO/VICODIN) 5-325 MG per tablet Take 2 tablets by mouth every 4 (four) hours as needed. Patient not taking: Reported on 12/08/2015 12/03/14   Elson Areas, PA-C  ibuprofen (ADVIL,MOTRIN) 800 MG tablet Take 1 tablet (800 mg total) by mouth 3 (three) times daily. Patient not taking: Reported on 12/08/2015 08/18/15   Dahlia Client Muthersbaugh, PA-C  ketorolac (ACULAR) 0.5 % ophthalmic solution Place 1 drop into both eyes 4 (four) times daily. Patient not taking: Reported on 12/08/2015 06/22/14   Garlon Hatchet, PA-C     Objective:   Filed Vitals:   12/19/15 1158  BP: 115/70  Pulse: 82  Temp: 98.7 F (37.1 C)  TempSrc: Oral  Resp: 18  Height:  (1.676 m)  Weight: 196 lb 9.6 oz (89.177 kg)  SpO2: 100%    Exam General appearance : Awake, alert, not in any distress. Speech Clear. Not toxic looking HEENT: Atraumatic and Normocephalic, pupils equally reactive to light and accomodation Neck: supple, no JVD. No cervical lymphadenopathy.  Chest:Good air entry bilaterally, no added sounds  CVS: S1 S2 regular, no murmurs.  Abdomen: Bowel sounds present, Non tender and not distended with no gaurding, rigidity or rebound. Extremities: B/L Lower Ext shows no edema, both legs are warm to touch Neurology: Awake alert, and oriented X 3, CN II-XII intact,  Non focal Skin:No Rash  Data Review Lab Results  Component Value Date   HGBA1C 7.80 12/19/2015   HGBA1C 9.0 02/19/2015   HGBA1C >15.0 11/15/2014     Assessment & Plan   1. Type 1 diabetes mellitus with complication [facility]):  Blood sugar log showed marked improvement in control , hemoglobin A1c is down to 7.8% from more than 15% one year ago.  - Glucose (CBG) - POCT A1C - Microalbumin/Creatinine Ratio, Urine Refill  - Insulin Glargine (LANTUS SOLOSTAR) 100 UNIT/ML Solostar Pen; Inject  30 units into skin daily at 10pm. Must have office visit for refills  Dispense: 15 mL; Refill: 3  - insulin aspart protamine - aspart (NOVOLOG 70/30 MIX) (70-30) 100 UNIT/ML FlexPen; Inject 0.25 mLs (25 Units total) into the skin 2 (two) times daily.  Dispense: 5 pen; Refill: 11  - COMPLETE METABOLIC PANEL WITH GFR - TSH  Aim for 30 minutes of exercise most days. Rethink what you drink. Water is great! Aim for 2-3 Carb Choices per meal (30-45 grams) +/- 1 either way  Aim for 0-15 Carbs per snack if hungry  Include protein in moderation with your meals and snacks  Consider reading food labels for Total Carbohydrate and Fat Grams of foods  Consider checking BG at alternate times per day  Continue taking medication as directed Be mindful about how much sugar you are adding to beverages and other foods. Fruit Punch - find one with no sugar  Measure and decrease portions of carbohydrate foods  Make your plate and don't go back for seconds   2. Cataract due to DM [facility])    Patient has appointment with ophthalmologist For cataract extraction  Patient have been counseled extensively about nutrition and exercise  Return in about 3 months (around 03/17/2016) for Hemoglobin A1C and Follow up, DM.  The patient was given clear instructions to go to ER or return to medical center if symptoms don't improve, worsen or new problems develop. The patient verbalized understanding. The patient was told to call to get lab results if they haven't heard anything in the next week.   This note has been created with Education officer, environmental. Any transcriptional errors are unintentional.    Jeanann Lewandowsky, MD, MHA, FACP, FAAP, CPE [facility] [place], [place] [phone]   12/19/2015, 12:05 PM

## 2015-12-19 NOTE — Patient Instructions (Signed)
Diabetes and Foot Care Diabetes may cause you to have problems because of poor blood supply (circulation) to your feet and legs. This may cause the skin on your feet to become thinner, break easier, and heal more slowly. Your skin may become dry, and the skin may peel and crack. You may also have nerve damage in your legs and feet causing decreased feeling in them. You may not notice minor injuries to your feet that could lead to infections or more serious problems. Taking care of your feet is one of the most important things you can do for yourself.  HOME CARE INSTRUCTIONS  Wear shoes at all times, even in the house. Do not go barefoot. Bare feet are easily injured.  Check your feet daily for blisters, cuts, and redness. If you cannot see the bottom of your feet, use a mirror or ask someone for help.  Wash your feet with warm water (do not use hot water) and mild soap. Then pat your feet and the areas between your toes until they are completely dry. Do not soak your feet as this can dry your skin.  Apply a moisturizing lotion or petroleum jelly (that does not contain alcohol and is unscented) to the skin on your feet and to dry, brittle toenails. Do not apply lotion between your toes.  Trim your toenails straight across. Do not dig under them or around the cuticle. File the edges of your nails with an emery board or nail file.  Do not cut corns or calluses or try to remove them with medicine.  Wear clean socks or stockings every day. Make sure they are not too tight. Do not wear knee-high stockings since they may decrease blood flow to your legs.  Wear shoes that fit properly and have enough cushioning. To break in new shoes, wear them for just a few hours a day. This prevents you from injuring your feet. Always look in your shoes before you put them on to be sure there are no objects inside.  Do not cross your legs. This may decrease the blood flow to your feet.  If you find a minor scrape,  cut, or break in the skin on your feet, keep it and the skin around it clean and dry. These areas may be cleansed with mild soap and water. Do not cleanse the area with peroxide, alcohol, or iodine.  When you remove an adhesive bandage, be sure not to damage the skin around it.  If you have a wound, look at it several times a day to make sure it is healing.  Do not use heating pads or hot water bottles. They may burn your skin. If you have lost feeling in your feet or legs, you may not know it is happening until it is too late.  Make sure your health care provider performs a complete foot exam at least annually or more often if you have foot problems. Report any cuts, sores, or bruises to your health care provider immediately. SEEK MEDICAL CARE IF:   You have an injury that is not healing.  You have cuts or breaks in the skin.  You have an ingrown nail.  You notice redness on your legs or feet.  You feel burning or tingling in your legs or feet.  You have pain or cramps in your legs and feet.  Your legs or feet are numb.  Your feet always feel cold. SEEK IMMEDIATE MEDICAL CARE IF:   There is increasing redness,   swelling, or pain in or around a wound.  There is a red line that goes up your leg.  Pus is coming from a wound.  You develop a fever or as directed by your health care provider.  You notice a bad smell coming from an ulcer or wound.   This information is not intended to replace advice given to you by your health care provider. Make sure you discuss any questions you have with your health care provider.   Document Released: 10/23/2000 Document Revised: 06/28/2013 Document Reviewed: 04/04/2013 Elsevier Interactive Patient Education 2016 Elsevier Inc. Diabetes and Exercise Exercising regularly is important. It is not just about losing weight. It has many health benefits, such as:  Improving your overall fitness, flexibility, and endurance.  Increasing your bone  density.  Helping with weight control.  Decreasing your body fat.  Increasing your muscle strength.  Reducing stress and tension.  Improving your overall health. People with diabetes who exercise gain additional benefits because exercise:  Reduces appetite.  Improves the body's use of blood sugar (glucose).  Helps lower or control blood glucose.  Decreases blood pressure.  Helps control blood lipids (such as cholesterol and triglycerides).  Improves the body's use of the hormone insulin by:  Increasing the body's insulin sensitivity.  Reducing the body's insulin needs.  Decreases the risk for heart disease because exercising:  Lowers cholesterol and triglycerides levels.  Increases the levels of good cholesterol (such as high-density lipoproteins [HDL]) in the body.  Lowers blood glucose levels. YOUR ACTIVITY PLAN  Choose an activity that you enjoy, and set realistic goals. To exercise safely, you should begin practicing any new physical activity slowly, and gradually increase the intensity of the exercise over time. Your health care provider or diabetes educator can help create an activity plan that works for you. General recommendations include:  Encouraging children to engage in at least 60 minutes of physical activity each day.  Stretching and performing strength training exercises, such as yoga or weight lifting, at least 2 times per week.  Performing a total of at least 150 minutes of moderate-intensity exercise each week, such as brisk walking or water aerobics.  Exercising at least 3 days per week, making sure you allow no more than 2 consecutive days to pass without exercising.  Avoiding long periods of inactivity (90 minutes or more). When you have to spend an extended period of time sitting down, take frequent breaks to walk or stretch. RECOMMENDATIONS FOR EXERCISING WITH TYPE 1 OR TYPE 2 DIABETES   Check your blood glucose before exercising. If blood  glucose levels are greater than 240 mg/dL, check for urine ketones. Do not exercise if ketones are present.  Avoid injecting insulin into areas of the body that are going to be exercised. For example, avoid injecting insulin into:  The arms when playing tennis.  The legs when jogging.  Keep a record of:  Food intake before and after you exercise.  Expected peak times of insulin action.  Blood glucose levels before and after you exercise.  The type and amount of exercise you have done.  Review your records with your health care provider. Your health care provider will help you to develop guidelines for adjusting food intake and insulin amounts before and after exercising.  If you take insulin or oral hypoglycemic agents, watch for signs and symptoms of hypoglycemia. They include:  Dizziness.  Shaking.  Sweating.  Chills.  Confusion.  Drink plenty of water while you exercise to prevent   dehydration or heat stroke. Body water is lost during exercise and must be replaced.  Talk to your health care provider before starting an exercise program to make sure it is safe for you. Remember, almost any type of activity is better than none.   This information is not intended to replace advice given to you by your health care provider. Make sure you discuss any questions you have with your health care provider.   Document Released: 01/16/2004 Document Revised: 03/12/2015 Document Reviewed: 04/04/2013 Elsevier Interactive Patient Education 2016 Elsevier Inc. Basic Carbohydrate Counting for Diabetes Mellitus Carbohydrate counting is a method for keeping track of the amount of carbohydrates you eat. Eating carbohydrates naturally increases the level of sugar (glucose) in your blood, so it is important for you to know the amount that is okay for you to have in every meal. Carbohydrate counting helps keep the level of glucose in your blood within normal limits. The amount of carbohydrates  allowed is different for every person. A dietitian can help you calculate the amount that is right for you. Once you know the amount of carbohydrates you can have, you can count the carbohydrates in the foods you want to eat. Carbohydrates are found in the following foods:  Grains, such as breads and cereals.  Dried beans and soy products.  Starchy vegetables, such as potatoes, peas, and corn.  Fruit and fruit juices.  Milk and yogurt.  Sweets and snack foods, such as cake, cookies, candy, chips, soft drinks, and fruit drinks. CARBOHYDRATE COUNTING There are two ways to count the carbohydrates in your food. You can use either of the methods or a combination of both. Reading the "Nutrition Facts" on Packaged Food The "Nutrition Facts" is an area that is included on the labels of almost all packaged food and beverages in the [place]. It includes the serving size of that food or beverage and information about the nutrients in each serving of the food, including the grams (g) of carbohydrate per serving.  Decide the number of servings of this food or beverage that you will be able to eat or drink. Multiply that number of servings by the number of grams of carbohydrate that is listed on the label for that serving. The total will be the amount of carbohydrates you will be having when you eat or drink this food or beverage. Learning Standard Serving Sizes of Food When you eat food that is not packaged or does not include "Nutrition Facts" on the label, you need to measure the servings in order to count the amount of carbohydrates.A serving of most carbohydrate-rich foods contains about 15 g of carbohydrates. The following list includes serving sizes of carbohydrate-rich foods that provide 15 g ofcarbohydrate per serving:   1 slice of bread (1 oz) or 1 six-inch tortilla.    of a hamburger bun or English muffin.  4-6 crackers.   cup unsweetened dry cereal.    cup hot cereal.    cup rice or pasta.    cup mashed potatoes or  of a large baked potato.  1 cup fresh fruit or one small piece of fruit.    cup canned or frozen fruit or fruit juice.  1 cup milk.   cup plain fat-free yogurt or yogurt sweetened with artificial sweeteners.   cup cooked dried beans or starchy vegetable, such as peas, corn, or potatoes.  Decide the number of standard-size servings that you will eat. Multiply that number of servings by 15 (the grams   of carbohydrates in that serving). For example, if you eat 2 cups of strawberries, you will have eaten 2 servings and 30 g of carbohydrates (2 servings x 15 g = 30 g). For foods such as soups and casseroles, in which more than one food is mixed in, you will need to count the carbohydrates in each food that is included. EXAMPLE OF CARBOHYDRATE COUNTING Sample Dinner  3 oz chicken breast.   cup of brown rice.   cup of corn.  1 cup milk.   1 cup strawberries with sugar-free whipped topping.  Carbohydrate Calculation Step 1: Identify the foods that contain carbohydrates:   Rice.   Corn.   Milk.   Strawberries. Step 2:Calculate the number of servings eaten of each:   2 servings of rice.   1 serving of corn.   1 serving of milk.   1 serving of strawberries. Step 3: Multiply each of those number of servings by 15 g:   2 servings of rice x 15 g = 30 g.   1 serving of corn x 15 g = 15 g.   1 serving of milk x 15 g = 15 g.   1 serving of strawberries x 15 g = 15 g. Step 4: Add together all of the amounts to find the total grams of carbohydrates eaten: 30 g + 15 g + 15 g + 15 g = 75 g.   This information is not intended to replace advice given to you by your health care provider. Make sure you discuss any questions you have with your health care provider.   Document Released: 10/26/2005 Document Revised: 11/16/2014 Document Reviewed: 09/22/2013 Elsevier Interactive Patient Education 2016 Elsevier Inc.  

## 2015-12-20 LAB — MICROALBUMIN / CREATININE URINE RATIO
CREATININE, URINE: 277 mg/dL (ref 20–320)
MICROALB/CREAT RATIO: 4 ug/mg{creat} (ref <30)
Microalb, Ur: 1 mg/dL

## 2015-12-24 ENCOUNTER — Telehealth: Payer: Self-pay | Admitting: *Deleted

## 2015-12-24 NOTE — Telephone Encounter (Signed)
Message left with person for patient to return a phone call to [place] with [facility] at [phone]

## 2015-12-24 NOTE — Telephone Encounter (Signed)
-----   Message from Quentin Angst, MD sent at 12/23/2015  3:50 PM EST ----- Please inform patient of normal lab results. Recommended to recheck thyroid function test during her next visit.

## 2015-12-25 NOTE — Telephone Encounter (Signed)
Patient has been out of town. Person will relay message to the patient.

## 2015-12-26 NOTE — Telephone Encounter (Signed)
Patient verified DOB Patient informed of lab results being normal. Patient informed of thyroid being rechecked at next visit. Patient expressed her understanding and had no further questions.

## 2016-01-10 ENCOUNTER — Encounter (HOSPITAL_COMMUNITY): Payer: Self-pay

## 2016-01-10 ENCOUNTER — Emergency Department (HOSPITAL_COMMUNITY): Payer: Medicaid Other

## 2016-01-10 ENCOUNTER — Emergency Department (HOSPITAL_COMMUNITY)
Admission: EM | Admit: 2016-01-10 | Discharge: 2016-01-10 | Disposition: A | Discharge status: Home / Self Care | Payer: Medicaid Other | Attending: Emergency Medicine | Admitting: Emergency Medicine

## 2016-01-10 DIAGNOSIS — O99331 Smoking (tobacco) complicating pregnancy, first trimester: Secondary | ICD-10-CM | POA: Diagnosis not present

## 2016-01-10 DIAGNOSIS — R1030 Lower abdominal pain, unspecified: Secondary | ICD-10-CM

## 2016-01-10 DIAGNOSIS — Z794 Long term (current) use of insulin: Secondary | ICD-10-CM | POA: Diagnosis not present

## 2016-01-10 DIAGNOSIS — F17210 Nicotine dependence, cigarettes, uncomplicated: Secondary | ICD-10-CM | POA: Diagnosis not present

## 2016-01-10 DIAGNOSIS — O26899 Other specified pregnancy related conditions, unspecified trimester: Secondary | ICD-10-CM

## 2016-01-10 DIAGNOSIS — O2391 Unspecified genitourinary tract infection in pregnancy, first trimester: Secondary | ICD-10-CM | POA: Insufficient documentation

## 2016-01-10 DIAGNOSIS — Z79899 Other long term (current) drug therapy: Secondary | ICD-10-CM | POA: Diagnosis not present

## 2016-01-10 DIAGNOSIS — B9689 Other specified bacterial agents as the cause of diseases classified elsewhere: Secondary | ICD-10-CM

## 2016-01-10 DIAGNOSIS — N760 Acute vaginitis: Secondary | ICD-10-CM

## 2016-01-10 DIAGNOSIS — R109 Unspecified abdominal pain: Secondary | ICD-10-CM

## 2016-01-10 DIAGNOSIS — E119 Type 2 diabetes mellitus without complications: Secondary | ICD-10-CM | POA: Diagnosis not present

## 2016-01-10 DIAGNOSIS — O99281 Endocrine, nutritional and metabolic diseases complicating pregnancy, first trimester: Secondary | ICD-10-CM | POA: Insufficient documentation

## 2016-01-10 LAB — URINALYSIS, ROUTINE W REFLEX MICROSCOPIC
Bilirubin Urine: NEGATIVE
Glucose, UA: NEGATIVE mg/dL
HGB URINE DIPSTICK: NEGATIVE
Ketones, ur: NEGATIVE mg/dL
LEUKOCYTES UA: NEGATIVE
NITRITE: NEGATIVE
PROTEIN: NEGATIVE mg/dL
SPECIFIC GRAVITY, URINE: 1.018 (ref 1.005–1.030)
pH: 6.5 (ref 5.0–8.0)

## 2016-01-10 LAB — CBC
HCT: 32.8 % — ABNORMAL LOW (ref 36.0–46.0)
HEMOGLOBIN: 11.3 g/dL — AB (ref 12.0–15.0)
MCH: 27.1 pg (ref 26.0–34.0)
MCHC: 34.5 g/dL (ref 30.0–36.0)
MCV: 78.7 fL (ref 78.0–100.0)
PLATELETS: 194 10*3/uL (ref 150–400)
RBC: 4.17 MIL/uL (ref 3.87–5.11)
RDW: 13 % (ref 11.5–15.5)
WBC: 9.1 10*3/uL (ref 4.0–10.5)

## 2016-01-10 LAB — RAPID HIV SCREEN (HIV 1/2 AB+AG)
HIV 1/2 ANTIBODIES: NONREACTIVE
HIV-1 P24 ANTIGEN - HIV24: NONREACTIVE

## 2016-01-10 LAB — GC/CHLAMYDIA PROBE AMP (~~LOC~~) NOT AT ARMC
Chlamydia: NEGATIVE
NEISSERIA GONORRHEA: POSITIVE — AB

## 2016-01-10 LAB — WET PREP, GENITAL
Sperm: NONE SEEN
Trich, Wet Prep: NONE SEEN
YEAST WET PREP: NONE SEEN

## 2016-01-10 LAB — COMPREHENSIVE METABOLIC PANEL
ALBUMIN: 3.1 g/dL — AB (ref 3.5–5.0)
ALK PHOS: 61 U/L (ref 38–126)
ALT: 16 U/L (ref 14–54)
ANION GAP: 10 (ref 5–15)
AST: 21 U/L (ref 15–41)
BILIRUBIN TOTAL: 0.2 mg/dL — AB (ref 0.3–1.2)
BUN: 10 mg/dL (ref 6–20)
CALCIUM: 9.2 mg/dL (ref 8.9–10.3)
CO2: 23 mmol/L (ref 22–32)
CREATININE: 0.52 mg/dL (ref 0.44–1.00)
Chloride: 102 mmol/L (ref 101–111)
Glucose, Bld: 116 mg/dL — ABNORMAL HIGH (ref 65–99)
Potassium: 3.8 mmol/L (ref 3.5–5.1)
Sodium: 135 mmol/L (ref 135–145)
TOTAL PROTEIN: 6.6 g/dL (ref 6.5–8.1)

## 2016-01-10 LAB — I-STAT BETA HCG BLOOD, ED (MC, WL, AP ONLY)

## 2016-01-10 LAB — RPR: RPR Ser Ql: NONREACTIVE

## 2016-01-10 LAB — LIPASE, BLOOD: Lipase: 25 U/L (ref 11–51)

## 2016-01-10 MED ORDER — METRONIDAZOLE 0.75 % VA GEL: 1.0000 | Freq: Two times a day (BID) | VAGINAL | Status: AC

## 2016-01-10 MED ORDER — SODIUM CHLORIDE 0.9 % IV BOLUS (SEPSIS)
1000.0000 mL | Freq: Once | INTRAVENOUS | Status: AC
  Administered 2016-01-10: 1000 mL via INTRAVENOUS

## 2016-01-10 MED ORDER — ONDANSETRON HCL 4 MG/2ML IJ SOLN
4.0000 mg | Freq: Once | INTRAMUSCULAR | Status: AC
  Administered 2016-01-10: 4 mg via INTRAVENOUS
  Filled 2016-01-10: qty 2

## 2016-01-10 MED ORDER — MORPHINE SULFATE (PF) 4 MG/ML IV SOLN
4.0000 mg | Freq: Once | INTRAVENOUS | Status: AC
  Administered 2016-01-10: 4 mg via INTRAVENOUS
  Filled 2016-01-10: qty 1

## 2016-01-10 NOTE — ED Notes (Signed)
Pt c/o lower abd pain that started today, denies n/v/d, denies urinary symptoms. Pt states that pain feels sharp.

## 2016-01-10 NOTE — ED Provider Notes (Signed)
CSN: [id number]     Arrival date & time 01/10/16  0622 History   First MD Initiated Contact with Patient 01/10/16 [phone]     Chief Complaint  Patient presents with  . Abdominal Pain     (Consider location/radiation/quality/duration/timing/severity/associated sxs/prior Treatment) HPI   20 year old female with history of insulin-dependent diabetes who presents for evaluation of abdominal pain. Patient reports gradual onset of low abnormal pain which started yesterday afternoon has been persistent since. She described pain as a sharp sensation, with associated nausea but without vomiting diarrhea constipation. Pain is rated as 9 out of 10 without any specific treatment tried. No associated fever, chills, chest pain, shortness of breath, productive cough, dysuria, urinary frequency or urgency, vaginal bleeding, or vaginal discharge. Patient states she is usually regular with her menstruation but her last menstrual period was January 23. She is a G2 P1. She reported her diabetes is well controlled with insulin.  Past Medical History  Diagnosis Date  . Diabetes mellitus without complication (HCC)     had since she was 16   Past Surgical History  Procedure Laterality Date  . Incision and drainage of peritonsillar abcess N/A 12/10/2013    Procedure: INCISION AND DRAINAGE OF PERITONSILLAR ABCESS;  Surgeon: Flo Shanks, MD;  Location: [facility] OR;  Service: ENT;  Laterality: N/A;  . Tonsillectomy Left 12/10/2013    Procedure: TONSILLECTOMY;  Surgeon: Flo Shanks, MD;  Location: [facility] OR;  Service: ENT;  Laterality: Left;  . Tonsillectomy  12/10/2013  . Mass biopsy Right 12/29/2013    Procedure: IRRIGATION AND DEBRIDEMENT RIGHT NECK   ;  Surgeon: Flo Shanks, MD;  Location: [facility] OR;  Service: ENT;  Laterality: Right;  . Tracheostomy tube placement N/A 12/31/2013    Procedure: TRACHEOSTOMY;  Surgeon: Christia Reading, MD;  Location: [facility] OR;  Service: ENT;  Laterality: N/A;   Family History  Problem Relation Age of  Onset  . Diabetes Mother   . Hypertension Mother    Social History  Substance Use Topics  . Smoking status: Current Every Day Smoker    Types: Cigars  . Smokeless tobacco: Never Used     Comment: Smoking 1 cigar every day  . Alcohol Use: No   OB History    Gravida Para Term Preterm AB TAB SAB Ectopic Multiple Living   0 0 0 0 0 0 1     Review of Systems  All other systems reviewed and are negative.     Allergies  Flu virus vaccine  Home Medications   Prior to Admission medications   Medication Sig Start Date End Date Taking? Authorizing Provider  bacitracin ointment Apply 1 application topically 2 (two) times daily. Patient not taking: Reported on 12/08/2015 08/18/15   Dahlia Client Muthersbaugh, PA-C  glucose blood (FREESTYLE LITE) test strip Use as instructed 11/15/14   Quentin Angst, MD  glucose blood test strip Use as instructed 01/12/14   Richarda Overlie, MD  HYDROcodone-acetaminophen (NORCO/VICODIN) 5-325 MG per tablet Take 2 tablets by mouth every 4 (four) hours as needed. Patient not taking: Reported on 12/08/2015 12/03/14   Elson Areas, PA-C  ibuprofen (ADVIL,MOTRIN) 800 MG tablet Take 1 tablet (800 mg total) by mouth 3 (three) times daily. Patient not taking: Reported on 12/08/2015 08/18/15   Dahlia Client Muthersbaugh, PA-C  insulin aspart protamine - aspart (NOVOLOG 70/30 MIX) (70-30) 100 UNIT/ML FlexPen Inject 0.25 mLs (25 Units total) into the skin 2 (two) times daily. 12/19/15   Quentin Angst, MD  Insulin Glargine (LANTUS SOLOSTAR) 100 UNIT/ML Solostar Pen Inject 30 units into skin daily at 10pm. Must have office visit for refills 12/19/15   Quentin Angstlugbemiga E Jegede, MD  Insulin Pen Needle (ULTICARE MICRO PEN NEEDLES) 32G X 4 MM MISC Use as directed 04/24/15   Quentin Angstlugbemiga E Jegede, MD  ketorolac (ACULAR) 0.5 % ophthalmic solution Place 1 drop into both eyes 4 (four) times daily. Patient not taking: Reported on 12/08/2015 06/22/14   Garlon HatchetLisa M Sanders, PA-C  Lancets (FREESTYLE)  lancets Use as instructed 11/15/14   Quentin Angstlugbemiga E Jegede, MD   BP 118/72 mmHg  Pulse 68  Temp(Src) 98.1 F (36.7 C)  Resp 18  Ht 5\' 6"  (1.676 m)  Wt 87.091 kg  BMI 31.00 kg/m2  SpO2 100%  LMP 12/02/2015 Physical Exam  Constitutional: She appears well-developed and well-nourished. No distress.  HENT:  Head: Atraumatic.  Eyes: Conjunctivae are normal.  Neck: Neck supple.  Cardiovascular: Normal rate and regular rhythm.   Pulmonary/Chest: Effort normal and breath sounds normal.  Abdominal: Soft. Bowel sounds are normal. She exhibits no distension. There is tenderness (superpubic tenderness on palpation without guarding or rebound tenderness. Negative Murphy sign, no pain at McBurney's point.).  Genitourinary:  Chaperone present during exam. No inguinal lymphadenopathy or inguinal hernia noted. Normal external genitalia. Normal vaginal vault. Normal cervical os with out any significant discharge. On bimanual examination patient have both left and right adnexal tenderness without cervical motion tenderness.  Neurological: She is alert.  Skin: No rash noted.  Psychiatric: She has a normal mood and affect.  Nursing note and vitals reviewed.   ED Course  Procedures (including critical care time) Labs Review Labs Reviewed  WET PREP, GENITAL - Abnormal; Notable for the following:    Clue Cells Wet Prep HPF POC PRESENT (*)    WBC, Wet Prep HPF POC FEW (*)    All other components within normal limits  COMPREHENSIVE METABOLIC PANEL - Abnormal; Notable for the following:    Glucose, Bld 116 (*)    Albumin 3.1 (*)    Total Bilirubin 0.2 (*)    All other components within normal limits  CBC - Abnormal; Notable for the following:    Hemoglobin 11.3 (*)    HCT 32.8 (*)    All other components within normal limits  I-STAT BETA HCG BLOOD, ED ([facility], [facility], [facility] ONLY) - Abnormal; Notable for the following:    I-stat hCG, quantitative >2000.0 (*)    All other components within normal limits  LIPASE,  BLOOD  URINALYSIS, ROUTINE W REFLEX MICROSCOPIC (NOT AT [facility])  RAPID HIV SCREEN (HIV 1/2 AB+AG)  RPR  GC/CHLAMYDIA PROBE AMP ([place]) NOT [facility]    Imaging Review [place] Ob Comp Less 14 Wks  01/10/2016  CLINICAL DATA:  Pregnant, lower abdominal pain x1 day EXAM: OBSTETRIC <14 WK US AND TRANSVAGINAL OB US TECHNIQUE: Both transabdominal and transvaginal ultrasound examinations were performed for complete evaluation of the gestation as well as the maternal uterus, adnexal regions, and pelvic cul-de-sac. Transvaginal technique was performed to assess early pregnancy. COMPARISON:  None. FINDINGS: Intrauterine gestational sac: Visualized/normal in shape. Yolk sac:  Present Embryo:  Present Cardiac Activity: Present Heart Rate: 175  bpm CRL:  27.9  mm   9 w   4 d                  US EDC: 08/10/2016 Subchorionic hemorrhage:  None visualized. Maternal uterus/adnexae: Right ovary is notable for a 2.5 x 1.2  x 2.1 cm hemorrhagic corpus luteal cyst. Left ovary is within normal limits. No free fluid. IMPRESSION: Single live intrauterine gestation with estimated gestational age [redacted] weeks 4 days by crown-rump length. Electronically Signed   By: Charline Bills M.D.   On: 01/10/2016 09:27   US Ob Transvaginal  01/10/2016  CLINICAL DATA:  Pregnant, lower abdominal pain x1 day EXAM: OBSTETRIC <14 WK [place] AND TRANSVAGINAL OB US TECHNIQUE: Both transabdominal and transvaginal ultrasound examinations were performed for complete evaluation of the gestation as well as the maternal uterus, adnexal regions, and pelvic cul-de-sac. Transvaginal technique was performed to assess early pregnancy. COMPARISON:  None. FINDINGS: Intrauterine gestational sac: Visualized/normal in shape. Yolk sac:  Present Embryo:  Present Cardiac Activity: Present Heart Rate: 175  bpm CRL:  27.9  mm   9 w   4 d                  [place] EDC: 08/10/2016 Subchorionic hemorrhage:  None visualized. Maternal uterus/adnexae: Right ovary is notable for a 2.5 x 1.2 x 2.1  cm hemorrhagic corpus luteal cyst. Left ovary is within normal limits. No free fluid. IMPRESSION: Single live intrauterine gestation with estimated gestational age [redacted] weeks 4 days by crown-rump length. Electronically Signed   By: Charline Bills M.D.   On: 01/10/2016 09:27   I have personally reviewed and evaluated these images and lab results as part of my medical decision-making.   EKG Interpretation None      MDM   Final diagnoses:  Abdominal pain during pregnancy  BV (bacterial vaginosis)   BP 112/68 mmHg  Pulse 60  Temp(Src) 98.1 F (36.7 C)  Resp 16  Ht  (1.676 m)  Wt 87.091 kg  BMI 31.00 kg/m2  SpO2 100%  LMP 12/02/2015   6:51 AM Patient presents with low abdominal pain. Pt missed her LMP.  She has a nonsurgical abdomen. Workup initiated. She would likely need a pelvic examination.  7:43 AM Patient have both left and right adnexal tenderness on pelvic examination. Her pregnancy test is positive. She will need a transvaginal ultrasound to rule out ectopic.  8:23 AM Wet prep demonstrated evidence of clue cells. She did report having noticing malodorous odor.  I will prescribe metronidazole gel as treatment for BV.  A transvaginal ultrasound demonstrates a single living intrauterine gestation estimated to be 9 weeks and 4 days. Patient is made aware of this finding. She is about to travel to [place] to live with her family. She will be driving for the next 42 hours. I discussed the possibility of increasing risk of blood clots due to pregnancy and prolonged driving and encouraged patient to seek for medical evaluation if she developed chest pain shortness of breath, calf pain or leg swelling. Patient currently is resting topically without any significant abdominal pain. She is stable for discharge.  Care discussed with DR. Madilyn Hook.   Currently i have low suspicion for acute pathology such as appendicitis causing her discomfort.    Fayrene Helper, PA-C 01/10/16  1610  Zadie Rhine, MD 01/10/16 2330

## 2016-01-10 NOTE — ED Notes (Signed)
Pt. Returned from ultrasound at this time

## 2016-01-10 NOTE — Discharge Instructions (Signed)
You are pregnant and are 9weeks and 4 days into your pregnancy. You also have evidence of bacterial vaginosis.  Use metrogel applicators as prescribed as treatment.  Follow up closely with an OBGYN for further management of your pregnancy.    Abdominal Pain During Pregnancy Abdominal pain is common in pregnancy. Most of the time, it does not cause harm. There are many causes of abdominal pain. Some causes are more serious than others. Some of the causes of abdominal pain in pregnancy are easily diagnosed. Occasionally, the diagnosis takes time to understand. Other times, the cause is not determined. Abdominal pain can be a sign that something is very wrong with the pregnancy, or the pain may have nothing to do with the pregnancy at all. For this reason, always tell your health care provider if you have any abdominal discomfort. HOME CARE INSTRUCTIONS  Monitor your abdominal pain for any changes. The following actions may help to alleviate any discomfort you are experiencing:  Do not have sexual intercourse or put anything in your vagina until your symptoms go away completely.  Get plenty of rest until your pain improves.  Drink clear fluids if you feel nauseous. Avoid solid food as long as you are uncomfortable or nauseous.  Only take over-the-counter or prescription medicine as directed by your health care provider.  Keep all follow-up appointments with your health care provider. SEEK IMMEDIATE MEDICAL CARE IF:  You are bleeding, leaking fluid, or passing tissue from the vagina.  You have increasing pain or cramping.  You have persistent vomiting.  You have painful or bloody urination.  You have a fever.  You notice a decrease in your baby's movements.  You have extreme weakness or feel faint.  You have shortness of breath, with or without abdominal pain.  You develop a severe headache with abdominal pain.  You have abnormal vaginal discharge with abdominal pain.  You have  persistent diarrhea.  You have abdominal pain that continues even after rest, or gets worse. MAKE SURE YOU:   Understand these instructions.  Will watch your condition.  Will get help right away if you are not doing well or get worse.   This information is not intended to replace advice given to you by your health care provider. Make sure you discuss any questions you have with your health care provider.   Document Released: 10/26/2005 Document Revised: 08/16/2013 Document Reviewed: 05/25/2013 Elsevier Interactive Patient Education [employer]2016 [employer]

## 2016-01-14 ENCOUNTER — Telehealth (HOSPITAL_COMMUNITY): Payer: Self-pay

## 2016-01-14 NOTE — Telephone Encounter (Signed)
Results received from [facility].  (+) gonorrhea No antibiotic treatment or prescription given for STD.  Chart to MD office for review.  [facility] form attached.

## 2016-01-15 ENCOUNTER — Telehealth (HOSPITAL_BASED_OUTPATIENT_CLINIC_OR_DEPARTMENT_OTHER): Payer: Self-pay | Admitting: Emergency Medicine

## 2016-01-15 NOTE — Telephone Encounter (Signed)
Post ED Visit - Positive Culture Follow-up: Successful Patient Follow-Up  Culture assessed and recommendations reviewed by: []  Enzo BiNathan Batchelder, Pharm.D. []  Celedonio MiyamotoJeremy Frens, Pharm.D., BCPS []  Garvin FilaMike Maccia, Pharm.D. []  Georgina PillionElizabeth Martin, Pharm.D., BCPS []  HarrisonMinh Pham, [address]Pharm[address]D., BCPS, AAHIVP []  Estella HuskMichelle Turner, Pharm.D., BCPS, AAHIVP []  Tennis Mustassie Stewart, Pharm.D. []  Sherle Poeob Vincent, [address]Pharm[address]  Positive GC culture  [x]  Patient discharged without antimicrobial prescription and treatment is now indicated []  Organism is resistant to prescribed ED discharge antimicrobial []  Patient with positive blood cultures  Changes discussed with ED provider: Effie ShyWentz New antibiotic prescription needs to return to ED, [facility], or health dept for Rocephin 250mg  IM x 1   Attempting to reach patient   Berle MullMiller, Deeanne Deininger 01/15/2016, 12:51 PM

## 2016-01-20 NOTE — Telephone Encounter (Signed)
Patients sister called requesting that her medicne be transferred to another pharmacy. Patient needs to transfer her ovalog and other diabetic medicines to   [facility] [address],  [place], [place] [phone]   Please follow up.

## 2016-01-27 ENCOUNTER — Telehealth (HOSPITAL_BASED_OUTPATIENT_CLINIC_OR_DEPARTMENT_OTHER): Payer: Self-pay | Admitting: Emergency Medicine

## 2016-01-27 NOTE — Telephone Encounter (Signed)
Letter returned, no forwarding address, lost to followup 

## 2016-02-05 ENCOUNTER — Telehealth: Payer: Self-pay | Admitting: *Deleted

## 2016-02-05 NOTE — Telephone Encounter (Signed)
Patient verified DOB Patient states she no longer [facility]. Patient has established with a PCP in [place] which entered in her insulin into the system. Patient had no further concerns or questions at this time.

## 2016-02-05 NOTE — Telephone Encounter (Signed)
MA left a message with a female person to advise the patient to [facility].
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
